# Patient Record
Sex: Female | Born: 1965 | Race: White | Hispanic: No | Marital: Married | Smoking: Never smoker
Health system: Southern US, Community
[De-identification: ages and names within clinical notes are randomized; demographics above are authoritative.]

## PROBLEM LIST (undated history)

## (undated) DIAGNOSIS — D472 Monoclonal gammopathy: Secondary | ICD-10-CM

## (undated) DIAGNOSIS — N200 Calculus of kidney: Secondary | ICD-10-CM

## (undated) DIAGNOSIS — R161 Splenomegaly, not elsewhere classified: Secondary | ICD-10-CM

## (undated) DIAGNOSIS — E538 Deficiency of other specified B group vitamins: Secondary | ICD-10-CM

## (undated) DIAGNOSIS — M069 Rheumatoid arthritis, unspecified: Secondary | ICD-10-CM

## (undated) DIAGNOSIS — K760 Fatty (change of) liver, not elsewhere classified: Secondary | ICD-10-CM

## (undated) HISTORY — DX: Monoclonal gammopathy: D47.2

## (undated) HISTORY — DX: Deficiency of other specified B group vitamins: E53.8

## (undated) HISTORY — DX: Fatty (change of) liver, not elsewhere classified: K76.0

## (undated) HISTORY — DX: Splenomegaly, not elsewhere classified: R16.1

---

## 1989-01-12 HISTORY — PX: ECTOPIC PREGNANCY SURGERY: SHX613

## 1989-01-12 HISTORY — PX: SALPINGOOPHORECTOMY: SHX82

## 1998-05-14 HISTORY — PX: LAPAROSCOPIC SALPINGO OOPHERECTOMY: SHX5927

## 1998-08-23 ENCOUNTER — Emergency Department (HOSPITAL_COMMUNITY): Admission: EM | Admit: 1998-08-23 | Discharge: 1998-08-23 | Payer: Self-pay | Admitting: Emergency Medicine

## 1999-01-13 HISTORY — PX: HIP SURGERY: SHX245

## 1999-03-05 ENCOUNTER — Emergency Department (HOSPITAL_COMMUNITY): Admission: EM | Admit: 1999-03-05 | Discharge: 1999-03-05 | Payer: Self-pay

## 1999-03-09 ENCOUNTER — Encounter: Payer: Self-pay | Admitting: Urology

## 1999-03-09 ENCOUNTER — Observation Stay (HOSPITAL_COMMUNITY): Admission: EM | Admit: 1999-03-09 | Discharge: 1999-03-10 | Payer: Self-pay | Admitting: Emergency Medicine

## 1999-03-13 ENCOUNTER — Emergency Department (HOSPITAL_COMMUNITY): Admission: EM | Admit: 1999-03-13 | Discharge: 1999-03-13 | Payer: Self-pay

## 1999-08-31 ENCOUNTER — Encounter: Payer: Self-pay | Admitting: Emergency Medicine

## 1999-08-31 ENCOUNTER — Emergency Department (HOSPITAL_COMMUNITY): Admission: EM | Admit: 1999-08-31 | Discharge: 1999-08-31 | Payer: Self-pay | Admitting: Emergency Medicine

## 1999-12-04 ENCOUNTER — Emergency Department (HOSPITAL_COMMUNITY): Admission: EM | Admit: 1999-12-04 | Discharge: 1999-12-04 | Payer: Self-pay | Admitting: Internal Medicine

## 2000-01-17 ENCOUNTER — Other Ambulatory Visit: Admission: RE | Admit: 2000-01-17 | Discharge: 2000-01-17 | Payer: Self-pay | Admitting: *Deleted

## 2000-04-23 ENCOUNTER — Encounter: Payer: Self-pay | Admitting: Emergency Medicine

## 2000-04-23 ENCOUNTER — Emergency Department (HOSPITAL_COMMUNITY): Admission: EM | Admit: 2000-04-23 | Discharge: 2000-04-23 | Payer: Self-pay | Admitting: Emergency Medicine

## 2000-04-24 ENCOUNTER — Inpatient Hospital Stay (HOSPITAL_COMMUNITY): Admission: EM | Admit: 2000-04-24 | Discharge: 2000-04-30 | Payer: Self-pay | Admitting: Emergency Medicine

## 2000-04-24 ENCOUNTER — Encounter: Payer: Self-pay | Admitting: Emergency Medicine

## 2000-04-29 ENCOUNTER — Encounter: Payer: Self-pay | Admitting: Orthopedic Surgery

## 2000-06-29 ENCOUNTER — Encounter: Payer: Self-pay | Admitting: Specialist

## 2000-06-29 ENCOUNTER — Inpatient Hospital Stay (HOSPITAL_COMMUNITY): Admission: EM | Admit: 2000-06-29 | Discharge: 2000-07-03 | Payer: Self-pay | Admitting: Emergency Medicine

## 2002-01-27 ENCOUNTER — Other Ambulatory Visit: Admission: RE | Admit: 2002-01-27 | Discharge: 2002-01-27 | Payer: Self-pay | Admitting: Family Medicine

## 2003-01-23 ENCOUNTER — Emergency Department (HOSPITAL_COMMUNITY): Admission: EM | Admit: 2003-01-23 | Discharge: 2003-01-23 | Payer: Self-pay | Admitting: Emergency Medicine

## 2003-01-23 ENCOUNTER — Encounter: Payer: Self-pay | Admitting: Emergency Medicine

## 2008-06-16 ENCOUNTER — Encounter: Admission: RE | Admit: 2008-06-16 | Discharge: 2008-06-16 | Payer: Self-pay | Admitting: Internal Medicine

## 2008-07-13 ENCOUNTER — Encounter: Admission: RE | Admit: 2008-07-13 | Discharge: 2008-07-13 | Payer: Self-pay | Admitting: Internal Medicine

## 2010-06-04 ENCOUNTER — Encounter: Payer: Self-pay | Admitting: Internal Medicine

## 2010-09-29 NOTE — Discharge Summary (Signed)
[facility]  Patient:    [name], [name]                   MRN: [id number] Adm. Date:  [id number] Disc. Date: 04/30/00 Attending:  Cain Sieve Dictator:   Ralene Bathe, P.A. CC:         Dr. Ninetta Lights   Discharge Summary  ADMISSION DIAGNOSIS:  Septic arthritis left hip.  DISCHARGE DIAGNOSES: 1. Septic arthritis left hip, with unknown organism, negative cultures. 2. Status post irrigation and debridement, arthrotomy of left hip. 3. Peripherally inserted central catheter line placement for continued    intravenous antibiotics.  CONSULTATIONS:  Dr. Ninetta Lights, infectious disease.  OPERATIONS:  Left hip arthrotomy, irrigation and debridement.  Surgeon: Dr. Rennis Chris.  Assistant:  Cherly Beach, P.A.C.  Anesthesia:  General.  With placement of intraoperative Hemovac drain sutured in.  HISTORY OF PRESENT ILLNESS:  [name] is a 45 year old female who presented to the emergency room on the evening prior to admission with a 24-hour history of increasing severe left hip pain.  She woke up that morning with left hip pain that became quite severe and intractable throughout the day.  On admission evaluation by the ER staff, she was found to have limitations of left hip motion, and laboratory work revealed a white count within normal limits but a mildly elevated sedimentation rate at 34.  Plain x-rays negative. She initially had some fair relief of pain with analgesics and was scheduled for an aspiration of the left hip that morning.  Aspiration under fluoroscopic guidance found cloudy, purulent-appearing fluid.  She, therefore, was called for an orthopedic evaluation and was thought to require arthrotomy, irrigation and debridement of the left hip.  The risks and benefits were discussed with the patient, and she was in agreement to proceed.  HOSPITAL COURSE:  The patient was admitted and underwent the above-named procedure and tolerated this well.   Approximately 15 cc of purulent synovial fluid within the hip joint was found.  A Hemovac drain was sutured in.  The purulent-appearing fluid was sent for intraoperative culture and sensitivity and Grams stain, as well as the hip aspirate.  The patient tolerated the procedure well.  She was kept inpatient on IV antibiotics as cultures had continued to be held.  She was initially treated with Rocephin 1 g IV q.d. However, she was placed on Ancef postoperatively, then switched back to Rocephin 1 g IV q.d. at the recommendations of infectious disease.  Initially, the patients review of systems was negative.  Initial Grams stains also showed few wbcs, no organisms.  It was quite interesting that she had no growth from the cultures.  Infectious disease consult was called to assist with her care postoperatively.  Further workup included HIV testing, ANA rheumatoid factors, and GC probes for urine.  These were all essentially negative except for mild elevated RA factor at 33.  Her RPR also was nonreactive.  Urinalysis also shown to be negative.  All in all, the patient remained stable postoperatively.  She had good reduction of the hip pain.  The Hemovac was removed on postoperative day #3, on April 27, 2000, without complications.  The patient had a PICC line placed prior to discharge.  Hip incision remained dry, with no erythema, no drainage.  On recommendation of infectious disease she was felt to require 28-day total of IV Rocephin via the PICC line 1 g q.d.  On date April 30, 2000, the patient was afebrile. Incision was dry,  without drainage.  She was neurovascularly intact and tolerating ambulation without complications.  She was stable orthopedically and medically for discharge.  LABORATORY DATA:  Admission hemogram with white count of 5.9, hemoglobin 11.7. Potassium 3.3.  Otherwise normal.  HIV is nonreactive.  Synovial fluid cell count from initial aspirate showed turbid  appearance, 62,400 white cells, 90 neutrophils, 9 macrophages, no crystals.  Body fluid culture remained negative.  No organisms seen on Grams stain and few wbcs.  GC probe was negative.  Chlamydia probe negative.  Urine rheumatoid factor borderline elevated at 33, RPR nonreactive.  Antistreptolysin O 184 normal, ANA was negative at the time of discharge.  Radiology shows PICC line placement in the superior vena cava in satisfactory position.  Fluoroscopy used for left hip aspiration noted in chart.  CONDITION ON DISCHARGE:  Stable and improved.  DISPOSITION:  The patient is being discharged to home.  DISCHARGE PLAN:  The patient will receive IV Rocephin 1 g q.d. via the PICC line.  Follow up in our office in one week, call for a time.  At this time she may shower.  Keep PICC line and hip incision as dry as possible.  An ______ was placed over the PICC line bathing purposes.  The patient was given Lorcet 10/650 #40, 1 q.4-6h. p.r.n. pain.  Call the office for any further problems or increasing hip pain or drainage.  She should also document fevers, however, has continued to be afebrile. DD:  04/30/00 TD:  04/30/00 Job: [id number]

## 2010-09-29 NOTE — Discharge Summary (Signed)
[facility]  Patient:    [name], [name]                   MRN: [id number] Adm. Date:  [id number] Disc. Date: [id number] Attending:  Pierce Crane Dictator:   Alexzandrew L. Julien Girt, P.A.-C. CC:         Burnice Logan, M.D.   Discharge Summary  ADMITTING DIAGNOSIS:  Septic right hip.  DISCHARGE DIAGNOSIS:  Septic right hip, status post I&D right hip.  PROCEDURE:  The patient was taken to the OR on June 29, 2000, and underwent I&D of the right hip.  SURGEON:  Javier Docker, M.D.  ASSISTANT:  Cherly Beach, P.A.-C.  DRAIN:  Hemovac drain placed at the time of surgery.  CONSULTATIONS:  Infectious disease, Dr. Roxan Hockey.  BRIEF HISTORY:  The patient is a 45 year old female who has previously been seen by [place] for problems concerning her right hip.  She was seen in [facility] Emergency Department on the evening prior to admission for a 24 hour increasing severe right hip pain.  On evaluation the day of admission she was found to have limitations with right hip motion.  CBC revealed a normal white count, but a mildly elevated sedimentation rate.  Plain x-rays were negative. She did get relief from analgesics, but was scheduled for an aspiration. Aspiration showed cloudy, purulent appearing fluid.  Dr. Rennis Chris was called to evaluate.  The patient was seen and admitted for surgical intervention.  LABORATORY DATA:  CBC on admission was hemoglobin 14.1, hematocrit 39.7, elevated white count 10.9, red cell count 4.51.  Differential showed neutrophils elevated at 78, lymphs 17, monos 6, eosinophils 1, baso 0.  Follow up CBC the following day showed a decreased in white count to 9.8, hemoglobin and hematocrit were 11.8 and 33.1.  Last noted CBC taken on July 02, 2000, white count was down to 6.7, hemoglobin back up to 12.3 and hematocrit up to 34.8 and the differential was normal.  Sedimentation rate on admission was elevated at 33.   Urine pregnancy taken was negative.  Fluid aspiration: color yellow, had a turbid appearance, greater than 14,700 white cells.  Neutrophils was high at 96, but 0 lymphs.  Urinalysis on admission showed trace hemoglobin, many epithelial cells and only 0-5 red cells per high-power field. Follow up urinalysis showed only small hemoglobin trace, 15 ketones, rare epithelial cells and only 0-5 red cells.  Urine culture taken on July 01, 2000, no growth.  The wound culture taken at the time of surgery, gram stain showed moderate WBCs, no organisms, no growth after two days.  The second routine culture gram stain showed few WBCs, no organisms seen, no growth after two days.  Anaerobic cultures x 2 both showed no anaerobes isolated.  X-RAYS:  Right hip films taken on June 29, 2000, no evidence of fracture or dislocation, no other significant bone or soft tissue or abnormalities are identified.  Joint spaces are within normal limits, normal study.  CT of the pelvis and abdomen taken on June 29, 2000, normal CT of the abdomen.  CT of the pelvis shows a large cyst in the left ovary measuring 4 cm.  This is larger than on the previous exam where it is measuring only 3.4 cm.  Follow up ultrasound in four to six weeks is recommended.  No uretal calculi are demonstrated.  HOSPITAL COURSE:  The patient is admitted to [facility] due to sepsis of her right hip.  She  was taken to the emergency room later that evening and underwent an I&D of the right hip per Dr. Shelle Iron.  Hemovac drain was placed at the time of surgery.  Cultures were taken and sent for studies. The patient did have a mildly elevated white count noted on admission, however, white count did come back down.  She was placed on IV antibiotics to include Rocephin 1 g every 24 hours.  The Hemovac drain was pulled on postoperative day #2.  The patient did have some elevated temperatures on postoperative day #1 of 100.9 and also  on postoperative day #2 of 100.9. Infectious Disease was consulted and the patient was seen and evaluated by Dr. Burnice Logan.  MRI scan was recommended and also rheumatology consult.  The cultures were followed during the hospital course which appeared to be negative for organisms.  There was some belief that this may be an inflammatory process.  It was decided that we would have her follow up and have an evaluation by a rheumatologist and that she would have an MRI of both hips once the wounds had healed.  The patient continued to be treated symptomatically.  She was later weaned over to p.o. analgesics.  It was recommended that she continue on Levaquin by mouth once discharged.  On July 03, 2000, the patient was doing much better, minimal pain.  She had been weaned over to p.o. analgesics and it was decided that the patient would be discharged home at that time.  DISCHARGE PLAN: 1. The patient discharged home on July 03, 2000. 2. Discharge diagnosis, please see above.  DISCHARGE MEDICATIONS: 1. Percocet p.r.n. pain. 2. Robaxin p.r.n. spasms. 3. Levaquin 500 mg daily.  DIET:  As tolerated.  ACTIVITY:  Weight bearing as tolerated.  Dressing changes daily.  FOLLOW UP:  The patient is to follow up with Dr. Shelle Iron in approximately 7-10 days.  An outpatient rheumatologic evaluation has been scheduled with Dr. Jimmy Footman on Thursday, at 11:25.  His office number is [phone].  The patient is to follow with Dr. Jimmy Footman for rheumatologic evaluation.  DISPOSITION:  Home.  CONDITION UPON DISCHARGE:  Stable. DD:  07/26/00 TD:  07/26/00 Job: [id number]

## 2010-09-29 NOTE — H&P (Signed)
[facility]  Patient:    [name], [name]                   MRN: [id number] Adm. Date:  [id number] Disc. Date: [id number] Attending:  Pierce Crane Dictator:   Della Goo, P.A.                         History and Physical  This dictated history and physical is being dictated from Dr. Vania Rea. Supples written history and physical on the day of admission.  CHIEF COMPLAINT:  Left hip pain.  HISTORY OF PRESENT ILLNESS:  Ms. Gazzola is a 45 year old female who presented to [facility] Emergency Room on the evening prior to admission with a 24-hour history of increasing severe left hip pain.  On evaluation on the day of admission, she was found to have limitations of left hip motion.  Laboratory values revealed WBC count within normal limits, but a mildly elevated sedimentation rate.  Plain x-rays were negative.  She did get some relief from her analgesics and was scheduled for an aspiration of the left hip on the morning following her first presentation to the emergency room.  Aspiration under fluoroscopy showed cloudy, purulent-appearing fluid.  Dr. Rennis Chris was called to evaluate the patient to undergo surgical irrigation and debridement. Patient was admitted for surgical I&D for treatment of her septic arthritis of the left hip.  ALLERGIES:  No known drug allergies.  CURRENT MEDICATIONS:  None.  PAST MEDICAL HISTORY:  Negative.  PAST SURGICAL HISTORY:  Significant for an ectopic pregnancy in 1993, also an exploratory laparoscopy in the past.  SOCIAL HISTORY:  Patient recently separated from her husband.  She has rare intake of alcohol and no intake of tobacco.  She denies any substance abuse. She currently lives with a friend in a mobile home.  FAMILY HISTORY:  Noncontributory to present illness.  REVIEW OF SYSTEMS:  Negative with the exception of acute onset of left hip pain.  PHYSICAL EXAMINATION  GENERAL:  Patient is a  well-developed, well-nourished female, alert and oriented x 4, in moderate discomfort.  HEENT:  Normocephalic, atraumatic.  Pupils equal, round and reactive to light and accommodation.  Extraocular movements intact.  LUNGS:  Clear to auscultation.  HEART:  Regular rate and rhythm.  No murmur heard.  ABDOMEN:  Soft, nontender.  Bowel sounds present.  GENITAL:  Not performed, not pertinent to present illness.  RECTAL:  Not performed, not pertinent to present illness.  BREASTS:  Not performed, not pertinent to present illness.  EXTREMITIES:  Painful range of motion of the left hip.  Distal pulses and sensation are intact in both lower extremities.  IMPRESSION:  Septic left hip.  PLAN:  Patient is admitted to the hospital for surgical irrigation and debridement as well as IV antibiotic treatment. DD:  07/03/00 TD:  07/04/00 Job: [id number]

## 2010-09-29 NOTE — Op Note (Signed)
[facility]  Patient:    OTTILIA, [name]                   MRN: [id number] Proc. Date: 04/24/00 Adm. Date:  [id number] Disc. Date: [id number] Attending:  Ilene Qua                           Operative Report  PREOPERATIVE DIAGNOSIS:  Septic left hip.  POSTOPERATIVE DIAGNOSIS:  Septic left hip.  PROCEDURE:  Left hip arthrotomy, irrigation and debridement.  SURGEON:  Vania Rea. Supple, M.D.  ASSISTANTDruscilla Brownie. Shela Nevin, P.A.  ANESTHESIA:  General endotracheal.  ESTIMATED BLOOD LOSS:  Less than 50 cc.  DRAINS:  A medium Hemovac x 1.  INTRAOPERATIVE FINDINGS:  Approximately 15 cc of purulent synovial fluid within the hip joint.  HISTORY:  [name] is a 45 year old female who presented to the emergency room last evening with a 24-hour history of severe left hip pain.  She reports that she awoke in the morning with quite severe left hip pain which was intractable and became increasingly severe throughout the day.  On initial presentation to the emergency room, she was evaluated by the ER staff and was noted to have pain and limitations of left hip motion.  Lab work revealed a white count within normal limits but a mildly elevated sed rate at 34.  Plain radiographs were negative.  She subsequently had fair relief of her pain with analgesics and was scheduled for an aspiration of the left hip in the morning. Following aspiration under fluoroscopic guidance, she was found to have cloudy purulent-appearing fluid within the hip and she has subsequently been brought to the operating room for arthrotomy, irrigation and debridement of the left hip.  Preoperatively, [name] was counseled on treatment options as well as risks versus benefits thereof.  Possible complications of bleeding, infection, neurovascular injury, DVT, PE, persistent septic arthritis and osteoarthrosis were reviewed.  She understands and accepts and agrees with our  plan for arthrotomy, irrigation and debridement.  PROCEDURE IN DETAIL:  After undergoing routine preoperative evaluation, patient was brought to the operating room and placed supine on the operative table and underwent smooth induction of general endotracheal anesthesia.  She was turned to the right lateral decubitus position and appropriately padded and protected.  The left hip girdle region was sterilely prepped and draped in standard fashion.  She had already received a gram of Rocephin IV empirically. A standard posterior approach to the left hip was made through a minimal incision, beginning typically at the greater trochanter and extending posteriorly in line with the fiber of the gluteus maximus for at total length of approximately 6 cm.  Sharp dissection was carried down through the skin and subcu, with electrocautery used for hemostasis.  The fascia over the gluteus maximus was then divided, as were the fibers of the gluteus maximus which were split longitudinally; this allowed exposure of the piriformis and the short external rotators.  The piriformis was then traced distally towards its greater trochanteric insertion and divided with electrocautery and reflected posteriorly.  The underlying capsule was then divided using electrocautery and a capsulotomy was then made, approximately 2 x 2 cm.  Upon entrance into the hip joint, a copious amount of purulent-appearing fluid was obtained; this was sent for routine culture and sensitivity as well as Gram stain.  At this point, a pediatric feeding tube was then set into the hip  joint and multiple syringes of double antibiotic and saline irrigation were then passed through the hip joint as the hip was placed through a full range of motion.  We then placed a medium Hemovac drain in through the capsulotomy into the inferior recess of the hip joint and then brought it out through a separate stab wound. The surgical wound was then closed  with interrupted figure-of-eight #1 Vicryl for the fascia over the gluteus maximus, 2-0 Vicryl for the deep and superficial subcu and a running intracuticular 4-0 Monocryl for the skin. Steri-Strips were applied over the hip incision.  The drain was sutures into place.  Patient was then rolled supine, extubated and taken to the recovery room in stable condition. DD:  04/24/00 TD:  04/25/00 Job: [id number]

## 2010-09-29 NOTE — H&P (Signed)
[facility]  Patient:    [name], [name]                   MRN: [id number] Adm. Date:  [id number] Attending:  Pierce Crane Dictator:   Druscilla Brownie. Shela Nevin, P.A.                         History and Physical  CHIEF COMPLAINT:  "Pain in my left hip."  PRESENT ILLNESS:  This 45 year old female who has previously been seen by [place] for problems concerning her left hip and has previously undergone left hip arthrotomy on an admission on April 24, 2000.  At that time, Dr. Vania Rea. Supple performed arthrotomy, irrigation and debridement of the joint.  At that time, the synovial fluid from the left hip showed 62,400 wbcs, 90 neutrophils and macrophages.  Cultures were negative and no organisms were seen.  HIV was nonreactive.  Chlamydia probe was negative. Urine rheumatoid factor was border-elevated at 33.  RPR was nonreactive.  ANA was negative.  Patient was treated with Ancef postoperatively and then switched to Rocephin 1 g IV q.d.  Infectious disease saw the patient at that time.  She improved on these treatments and was discharged home.  This admission is for increasing pain into the same hip, which has been going on now for several weeks.  Patient will be admitted for arthrotomy of the left hip.  PAST MEDICAL HISTORY:  She is on no antibiotics at this time.  Please see previous H&P for details.  REVIEW OF SYSTEMS:  Essentially negative except for hip.  ALLERGIES:  No known drug allergies.  PHYSICAL EXAMINATION  GENERAL:  Alert, cooperative, friendly and somewhat distressed 45 year old white female whose vital signs are:  Blood pressure 137/82, pulse 97, respirations 24, temperature 97.4.  HEENT:  Normocephalic.  PERRLA.  Oropharynx was clear.  CHEST:  Clear to auscultation.  No rhonchi nor rales.  HEART:  Regular rate and rhythm.  No murmurs are heard.  ABDOMEN:  Soft and nontender.  Liver and spleen not  felt.  GENITALIA/PELVIC:  Not done, not pertinent to present illness.  BREASTS:  Not done, not pertinent to present illness.  RECTAL:  Not done, not pertinent to present illness.  EXTREMITIES:  Patient has severe hip pain with any attempted rotation on the left.  Sensory is intact.  ADMITTING DIAGNOSES 1. Possible infection to the right hip with sepsis -- septic right hip. 2. Previous ______  and debridement of left hip. DD:  06/30/00 TD:  06/30/00 Job: [id number]

## 2010-09-29 NOTE — Op Note (Signed)
[facility]  Patient:    [name], [name]                   MRN: [id number] Proc. Date: 06/29/00 Adm. Date:  [id number] Attending:  Pierce Crane                           Operative Report  PREOPERATIVE DIAGNOSIS:  Septic right hip.  POSTOPERATIVE DIAGNOSIS:  Septic right hip.  PROCEDURE:  Right hip open arthrotomy, irrigation and debridement.  ANESTHESIA:  General.  ASSISTANT:  Dooley L. Underwood III, P.A.-C.  BRIEF HISTORY AND INDICATION:  A 45 year old with a history of a left septic hip, I&D, presented to the ER with severe hip pain.  Elevated sedimentation rate, positive aspirate under fluoroscopy.  Operative intervention was indicated for irrigation and debridement of septic hip to prevent chondrolysis.  Risks and benefits discussed including bleeding, infection, damage to surrounding structures, no change in symptoms, need for repeat debridement, post-traumatic arthrosis, osteoarthritis, AV, etc.  TECHNIQUE:  The patient is in supine position.  After the induction of adequate general anesthesia, 1 gram of IV Rocephin given that the aspirate was sent for appropriate cultures and synovial analysis.  The patient was placed in the left lateral decubitus position.  All bony prominences well padded. Right peritrochanteric region and the lower extremity was prepped and draped in the usual sterile fashion.  Standard lateral approach was made to the hip. Incision made through the skin over the trochanter.  Subcutaneous tissue was dissected.  Electrocautery was utilized to achieve hemostasis.  Fascia lata identified and divided in line with the skin incision.  The gluteus medius was split.  Blunt dissection to the posterior capsule after the bursa was divided. Deaver blunt retractor was utilized to expose the external rotators of the hip.  Piriformis was then divided and traced to its insertion on the trochanter, was divided.  ______  insertion with electrocautery, retagged, and gently reflected posteriorly, protecting the sciatic nerve at all times.  The capsule was identified beneath this.  A small ______ otomy performed in the external rotators as well.  A capsulotomy was performed, roughly 2 x 2 cm. Tension to the hip wound produced approximately 5 cc of a cloudy purulent fluid.  This was cultured.  The hip was then copiously irrigated with a pediatric feeding tube with antibiotic irrigation.  The hip was then placed through a full range of motion while copious irrigation was performed.  A Hemovac was placed into the hip capsule and brought out through a separate stab wound anteriorly through the tensor fascia and the skin.  Soft tissue was copiously irrigated.  Retractors removed and the tensor fascia reapproximated with #1 Vicryl interrupted figure-of-eight sutures, subcutaneous tissue reapproximated with 2-0 Vicryl simple sutures.  Skin was reapproximated with staples and dressed sterilely.  Hemovac was charged.  The patient was extubated without difficulty and transported to the recovery room in satisfactory condition.  The patient tolerated the procedure well without complications. DD:  06/29/00 TD:  06/30/00 Job: [id number]/[id number]

## 2012-01-04 ENCOUNTER — Encounter (HOSPITAL_COMMUNITY): Payer: Self-pay

## 2012-01-04 ENCOUNTER — Emergency Department (HOSPITAL_COMMUNITY)
Admission: EM | Admit: 2012-01-04 | Discharge: 2012-01-04 | Disposition: A | Discharge status: Home Health | Payer: BC Managed Care – PPO | Attending: Emergency Medicine | Admitting: Emergency Medicine

## 2012-01-04 DIAGNOSIS — W208XXA Other cause of strike by thrown, projected or falling object, initial encounter: Secondary | ICD-10-CM | POA: Insufficient documentation

## 2012-01-04 DIAGNOSIS — R51 Headache: Secondary | ICD-10-CM | POA: Insufficient documentation

## 2012-01-04 DIAGNOSIS — M7989 Other specified soft tissue disorders: Secondary | ICD-10-CM | POA: Insufficient documentation

## 2012-01-04 DIAGNOSIS — R42 Dizziness and giddiness: Secondary | ICD-10-CM | POA: Insufficient documentation

## 2012-01-04 DIAGNOSIS — G44309 Post-traumatic headache, unspecified, not intractable: Secondary | ICD-10-CM

## 2012-01-04 MED ORDER — SODIUM CHLORIDE 0.9 % IV SOLN
1000.0000 mL | Freq: Once | INTRAVENOUS | Status: AC
  Administered 2012-01-04: 1000 mL via INTRAVENOUS

## 2012-01-04 MED ORDER — ACETAMINOPHEN 500 MG PO TABS: 500.0000 mg | ORAL_TABLET | Freq: Four times a day (QID) | ORAL | Status: AC | PRN

## 2012-01-04 MED ORDER — KETOROLAC TROMETHAMINE 30 MG/ML IJ SOLN
30.0000 mg | Freq: Once | INTRAMUSCULAR | Status: AC
  Administered 2012-01-04: 30 mg via INTRAVENOUS
  Filled 2012-01-04: qty 1

## 2012-01-04 MED ORDER — METOCLOPRAMIDE HCL 5 MG/ML IJ SOLN
10.0000 mg | Freq: Once | INTRAMUSCULAR | Status: AC
  Administered 2012-01-04: 10 mg via INTRAVENOUS
  Filled 2012-01-04: qty 2

## 2012-01-04 MED ORDER — DIPHENHYDRAMINE HCL 50 MG/ML IJ SOLN
12.5000 mg | Freq: Once | INTRAMUSCULAR | Status: AC
  Administered 2012-01-04: 12.5 mg via INTRAVENOUS
  Filled 2012-01-04: qty 1

## 2012-01-04 MED ORDER — SODIUM CHLORIDE 0.9 % IV SOLN
1000.0000 mL | INTRAVENOUS | Status: DC
  Administered 2012-01-04: 1000 mL via INTRAVENOUS

## 2012-01-04 NOTE — ED Notes (Signed)
Patient c/o diffuse headache, patient describes pain as throbbing

## 2012-01-04 NOTE — ED Notes (Signed)
Patient in NAD at this time, ambulating in room

## 2012-01-04 NOTE — ED Notes (Signed)
On Monday night ceiling collapsed and Wednesday am started having headaches, so came today.

## 2012-01-04 NOTE — ED Provider Notes (Signed)
History     CSN: [id number]  Arrival date & time 01/04/12  1238   None     Chief Complaint  Patient presents with  . Headache    (Consider location/radiation/quality/duration/timing/severity/associated sxs/prior treatment) HPI Comments: She denies any history of loss of sensation or weakness in any of her extremities. She has no visual changes, ear pain, or reduced hearing. She denies vomiting or neck pain. No unusual behavior, seizures, increased sleepiness, seizures, or tremors. There was no loss of consciousness immediately after the ceiling collapsed on her head.  Patient is a 45 y.o. female presenting with headaches. The history is provided by the patient.  Headache  This is a new problem. The current episode started 3 to 5 hours ago. The problem occurs constantly. The problem has not changed since onset.The pain is located in the temporal and bilateral region. The quality of the pain is described as dull. The pain is at a severity of 6/10. The pain is moderate. The pain does not radiate. Pertinent negatives include no malaise/fatigue, no near-syncope, no syncope, no shortness of breath, no nausea and no vomiting. She has tried NSAIDs for the symptoms. The treatment provided mild relief.    Past Medical History  Diagnosis Date  . Arthritis     No past surgical history on file.  No family history on file.  History  Substance Use Topics  . Smoking status: Never Smoker   . Smokeless tobacco: Not on file  . Alcohol Use: No    OB History    Grav Para Term Preterm Abortions TAB SAB Ect Mult Living                  Review of Systems  Constitutional: Negative.  Negative for malaise/fatigue.  Eyes: Negative for photophobia, pain, redness and visual disturbance.  Respiratory: Negative for chest tightness and shortness of breath.   Cardiovascular: Negative.  Negative for syncope and near-syncope.  Gastrointestinal: Negative.  Negative for nausea and vomiting.    Genitourinary: Negative.   Musculoskeletal: Negative.   Skin: Negative.   Neurological: Positive for light-headedness and headaches. Negative for speech difficulty and numbness.  Hematological: Negative.   Psychiatric/Behavioral: Negative.     Allergies  Celebrex  Home Medications   Current Outpatient Rx  Name Route Sig Dispense Refill  . ETANERCEPT 50 MG/ML [place] SOLN Subcutaneous Inject 50 mg into the skin once a week. On Mondays      BP 135/82  Pulse 71  Temp 97.9 F (36.6 C) (Oral)  Resp 16  SpO2 97%  Physical Exam  Constitutional: She is oriented to person, place, and time. She appears well-developed and well-nourished.  HENT:  Head: Normocephalic and atraumatic.  Eyes: Conjunctivae and EOM are normal. Pupils are equal, round, and reactive to light.  Neck: Normal range of motion. Neck supple.  Cardiovascular: Normal rate, regular rhythm and normal heart sounds.   Pulmonary/Chest: Effort normal and breath sounds normal. No respiratory distress. She has no wheezes. She has no rales. She exhibits no tenderness.  Abdominal: Soft. Bowel sounds are normal.  Musculoskeletal: She exhibits no edema and no tenderness.       Right elbow: She exhibits swelling.       Left elbow: She exhibits swelling.       There are posterior bilateral rheumatoid nodules on the elbows.  Neurological: She is alert and oriented to person, place, and time. She has normal strength and normal reflexes. She displays normal reflexes. No cranial nerve deficit  or sensory deficit. She exhibits normal muscle tone. She displays a negative Romberg sign. Coordination normal. GCS eye subscore is 4. GCS verbal subscore is 5. GCS motor subscore is 6. She displays no Babinski's sign on the right side. She displays no Babinski's sign on the left side.  Skin: Skin is warm.  Psychiatric: She has a normal mood and affect.    ED Course  Procedures (including critical care time)  Labs Reviewed - No data to display No  results found.   No diagnosis found.    MDM  In this 46 year old woman, with a past medical history of rheumatoid arthritis, who presents after 3 days history of head trauma, only complaining of headache without neurological findings or focal neurological deficits, I don't feel compelled to order any imaging studies of her brain. It is likely that she sustained a brain concussion. I have discussed this with her and she feels she can always come back if the pain worsens or if she develops any abnormal behavior, seizures, or loss of sensation or weakness in one of her limbs. I will further discuss with Dr. Lorenso Courier.  5:58 PM After a dose of Reglan, and ketorolac IV, she reports significant reduction in her headache. She will be discharged on Tylenol. I have encouraged her to seek medical attention if she experiences seizures, loss of consciousness, numbness, or any abnormal behavior.        Dow Adolph, MD 01/04/12 1800

## 2012-01-06 NOTE — ED Provider Notes (Signed)
I saw and evaluated the patient, reviewed the resident's note and I agree with the findings and plan.  Marwan T Powers, MD 01/06/12 1642 

## 2013-07-09 ENCOUNTER — Inpatient Hospital Stay (HOSPITAL_COMMUNITY)
Admission: EM | Admit: 2013-07-09 | Discharge: 2013-07-11 | DRG: 392 | Disposition: A | Discharge status: Home / Self Care | Payer: 59 | Attending: Family Medicine | Admitting: Family Medicine

## 2013-07-09 ENCOUNTER — Emergency Department (HOSPITAL_COMMUNITY): Payer: 59

## 2013-07-09 ENCOUNTER — Encounter (HOSPITAL_COMMUNITY): Payer: Self-pay | Admitting: Emergency Medicine

## 2013-07-09 DIAGNOSIS — M069 Rheumatoid arthritis, unspecified: Secondary | ICD-10-CM | POA: Diagnosis present

## 2013-07-09 DIAGNOSIS — E876 Hypokalemia: Secondary | ICD-10-CM | POA: Diagnosis present

## 2013-07-09 DIAGNOSIS — A088 Other specified intestinal infections: Principal | ICD-10-CM | POA: Diagnosis present

## 2013-07-09 DIAGNOSIS — D696 Thrombocytopenia, unspecified: Secondary | ICD-10-CM | POA: Diagnosis present

## 2013-07-09 DIAGNOSIS — R109 Unspecified abdominal pain: Secondary | ICD-10-CM

## 2013-07-09 DIAGNOSIS — R112 Nausea with vomiting, unspecified: Secondary | ICD-10-CM

## 2013-07-09 DIAGNOSIS — R1115 Cyclical vomiting syndrome unrelated to migraine: Secondary | ICD-10-CM

## 2013-07-09 DIAGNOSIS — K7689 Other specified diseases of liver: Secondary | ICD-10-CM | POA: Diagnosis present

## 2013-07-09 DIAGNOSIS — R197 Diarrhea, unspecified: Secondary | ICD-10-CM

## 2013-07-09 DIAGNOSIS — E860 Dehydration: Secondary | ICD-10-CM | POA: Diagnosis present

## 2013-07-09 DIAGNOSIS — A084 Viral intestinal infection, unspecified: Secondary | ICD-10-CM | POA: Diagnosis present

## 2013-07-09 DIAGNOSIS — D72819 Decreased white blood cell count, unspecified: Secondary | ICD-10-CM | POA: Diagnosis present

## 2013-07-09 DIAGNOSIS — R161 Splenomegaly, not elsewhere classified: Secondary | ICD-10-CM

## 2013-07-09 DIAGNOSIS — B9789 Other viral agents as the cause of diseases classified elsewhere: Secondary | ICD-10-CM | POA: Diagnosis present

## 2013-07-09 HISTORY — DX: Calculus of kidney: N20.0

## 2013-07-09 HISTORY — DX: Rheumatoid arthritis, unspecified: M06.9

## 2013-07-09 LAB — CBC WITH DIFFERENTIAL/PLATELET
BASOS ABS: 0 10*3/uL (ref 0.0–0.1)
Basophils Relative: 0 % (ref 0–1)
EOS PCT: 0 % (ref 0–5)
Eosinophils Absolute: 0 10*3/uL (ref 0.0–0.7)
HCT: 40.9 % (ref 36.0–46.0)
Hemoglobin: 15.4 g/dL — ABNORMAL HIGH (ref 12.0–15.0)
LYMPHS PCT: 27 % (ref 12–46)
Lymphs Abs: 1.1 10*3/uL (ref 0.7–4.0)
MCH: 33.3 pg (ref 26.0–34.0)
MCHC: 37.7 g/dL — ABNORMAL HIGH (ref 30.0–36.0)
MCV: 88.3 fL (ref 78.0–100.0)
Monocytes Absolute: 0.6 10*3/uL (ref 0.1–1.0)
Monocytes Relative: 15 % — ABNORMAL HIGH (ref 3–12)
NEUTROS ABS: 2.3 10*3/uL (ref 1.7–7.7)
Neutrophils Relative %: 58 % (ref 43–77)
PLATELETS: 85 10*3/uL — AB (ref 150–400)
RBC: 4.63 MIL/uL (ref 3.87–5.11)
RDW: 12.7 % (ref 11.5–15.5)
WBC: 4 10*3/uL (ref 4.0–10.5)

## 2013-07-09 LAB — COMPREHENSIVE METABOLIC PANEL
ALBUMIN: 4.4 g/dL (ref 3.5–5.2)
ALT: 59 U/L — AB (ref 0–35)
AST: 46 U/L — AB (ref 0–37)
Alkaline Phosphatase: 73 U/L (ref 39–117)
BUN: 12 mg/dL (ref 6–23)
CALCIUM: 9 mg/dL (ref 8.4–10.5)
CO2: 20 meq/L (ref 19–32)
Chloride: 100 mEq/L (ref 96–112)
Creatinine, Ser: 0.65 mg/dL (ref 0.50–1.10)
GFR calc Af Amer: >90 mL/min (ref >90)
Glucose, Bld: 104 mg/dL — ABNORMAL HIGH (ref 70–99)
Potassium: 3.5 mEq/L — ABNORMAL LOW (ref 3.7–5.3)
SODIUM: 137 meq/L (ref 137–147)
Total Bilirubin: 1.2 mg/dL (ref 0.3–1.2)
Total Protein: 8.6 g/dL — ABNORMAL HIGH (ref 6.0–8.3)

## 2013-07-09 LAB — URINE MICROSCOPIC-ADD ON

## 2013-07-09 LAB — URINALYSIS, ROUTINE W REFLEX MICROSCOPIC
Bilirubin Urine: NEGATIVE
GLUCOSE, UA: NEGATIVE mg/dL
Ketones, ur: >80 mg/dL — AB
Nitrite: NEGATIVE
PH: 6 (ref 5.0–8.0)
PROTEIN: NEGATIVE mg/dL
SPECIFIC GRAVITY, URINE: 1.025 (ref 1.005–1.030)
Urobilinogen, UA: 1 mg/dL (ref 0.0–1.0)

## 2013-07-09 LAB — POC URINE PREG, ED: PREG TEST UR: NEGATIVE

## 2013-07-09 LAB — LIPASE, BLOOD: Lipase: 36 U/L (ref 11–59)

## 2013-07-09 MED ORDER — ACETAMINOPHEN 325 MG PO TABS: 650.0000 mg | ORAL_TABLET | Freq: Four times a day (QID) | ORAL | Status: DC | PRN

## 2013-07-09 MED ORDER — KCL IN DEXTROSE-NACL 20-5-0.9 MEQ/L-%-% IV SOLN
INTRAVENOUS | Status: DC
  Administered 2013-07-09 – 2013-07-11 (×2): via INTRAVENOUS
  Filled 2013-07-09 (×6): qty 1000

## 2013-07-09 MED ORDER — IOHEXOL 300 MG/ML  SOLN
100.0000 mL | Freq: Once | INTRAMUSCULAR | Status: AC | PRN
  Administered 2013-07-09: 100 mL via INTRAVENOUS

## 2013-07-09 MED ORDER — METOCLOPRAMIDE HCL 5 MG/ML IJ SOLN
10.0000 mg | Freq: Once | INTRAMUSCULAR | Status: AC
  Administered 2013-07-09: 10 mg via INTRAVENOUS
  Filled 2013-07-09: qty 2

## 2013-07-09 MED ORDER — ENOXAPARIN SODIUM 40 MG/0.4ML ~~LOC~~ SOLN: 40.0000 mg | SUBCUTANEOUS | Status: DC

## 2013-07-09 MED ORDER — SODIUM CHLORIDE 0.9 % IV BOLUS (SEPSIS)
1000.0000 mL | Freq: Once | INTRAVENOUS | Status: AC
  Administered 2013-07-09: 1000 mL via INTRAVENOUS

## 2013-07-09 MED ORDER — PROMETHAZINE HCL 25 MG RE SUPP
25.0000 mg | Freq: Four times a day (QID) | RECTAL | Status: DC | PRN
  Filled 2013-07-09: qty 1

## 2013-07-09 MED ORDER — ONDANSETRON HCL 4 MG/2ML IJ SOLN
4.0000 mg | Freq: Four times a day (QID) | INTRAMUSCULAR | Status: DC | PRN
  Administered 2013-07-09 – 2013-07-10 (×3): 4 mg via INTRAVENOUS
  Filled 2013-07-09 (×3): qty 2

## 2013-07-09 MED ORDER — GI COCKTAIL ~~LOC~~
30.0000 mL | Freq: Once | ORAL | Status: AC
  Administered 2013-07-09: 30 mL via ORAL
  Filled 2013-07-09: qty 30

## 2013-07-09 MED ORDER — ONDANSETRON HCL 4 MG/2ML IJ SOLN
4.0000 mg | Freq: Once | INTRAMUSCULAR | Status: AC
  Administered 2013-07-09: 4 mg via INTRAVENOUS
  Filled 2013-07-09: qty 2

## 2013-07-09 MED ORDER — HYDROMORPHONE HCL PF 1 MG/ML IJ SOLN
1.0000 mg | Freq: Once | INTRAMUSCULAR | Status: AC
  Administered 2013-07-09: 1 mg via INTRAVENOUS
  Filled 2013-07-09: qty 1

## 2013-07-09 MED ORDER — MORPHINE SULFATE 2 MG/ML IJ SOLN
1.0000 mg | INTRAMUSCULAR | Status: DC | PRN
  Administered 2013-07-09 – 2013-07-10 (×3): 2 mg via INTRAVENOUS
  Filled 2013-07-09 (×3): qty 1

## 2013-07-09 MED ORDER — IOHEXOL 300 MG/ML  SOLN
25.0000 mL | Freq: Once | INTRAMUSCULAR | Status: AC | PRN
  Administered 2013-07-09: 25 mL via ORAL

## 2013-07-09 MED ORDER — ACETAMINOPHEN 650 MG RE SUPP: 650.0000 mg | Freq: Four times a day (QID) | RECTAL | Status: DC | PRN

## 2013-07-09 NOTE — ED Notes (Signed)
Dr Cook at bedside

## 2013-07-09 NOTE — H&P (Signed)
[facility] Admission History and Physical Service Pager: [phone]  Patient name: [name] Medical record number: [id number] Date of birth: 1965/11/08 Age: 48 y.o. Gender: female  Primary Care Provider: No PCP Per Patient Consultants: None Code Status: Full code  Chief Complaint: Nausea, vomiting, decreased PO intake  Assessment and Plan: [name] is a 48 y.o. female presenting with epigastric abdominal pain with associated nausea and decreased oral intake . PMH is significant for Rheumatoid arthritis.  Dehydration secondary to nausea and decreased PO intake, Epigastric abdominal pain - DDX: Viral, gastritis, PUD - Patient well appearing on exam.  No leukocytosis to suggest underlying bacterial infection.  - Urinalysis revealed small leukocytes and moderate hemoglobin; however patient denying dysuria. No indication for treatment at this time.  Will obtain urine culture. - CT abdomen/pelvis revealed no findings that would account for patient's epigastric pain.  He did however show splenomegaly and hepatic steatosis.   - Patient with normal lipase and mild LFT elevation. - Will treat with IV fluids and antiemetics and monitor closely for improvement.  - Clear liquid diet.  Splenomegaly and Thrombocytopenia - Unclear etiology, but could be secondary to ITP or underlying RA - Will continue to monitor closely and trend CBC.  Mild hypokalemia - K+ 3.5  - Repleting with K in fluids (see below)   FEN/GI: Clear liquid diet; D5 NS w/ 20 KCl @ 100 mL/hr Prophylaxis: Lovenox  Disposition:  Pending clinical improvement.   History of Present Illness:  [name] is a 48 y.o. female with past medical history rheumatoid arthritis who presents with a four-day history of epigastric abdominal pain with associated nausea and decreased oral intake.  Patient reports that her abdominal pain began 4 days ago.  Pain is located in the epigastric  region.  Pain described as sharp and severe (7/10). Pain has been constant and has been recently worsening.  It is been associated with nausea diarrhea.  She has also had a few episodes of emesis after trying to eat. Pain improves with "sleeping it off".  Abdominal pain is worsened by physical activity.   No other exacerbating or relieving factors.  Patient is unsure if it worsens with oral intake as she has been unable to keep anything down.  She denies any chest pain, shortness of breath, recent illness, hematemesis, hematochezia, melena.   Denies any alcohol or drug use.  Reports occasional NSAID use.    Review Of Systems: Per HPI.  Otherwise 12 point review of systems was performed and was unremarkable.  Past Medical History: Past Medical History  Diagnosis Date  . Arthritis   . Ectopic pregnancy   Dr. Dorothey Baseman - rheumatologist  Past Surgical History: Past Surgical History  Procedure Laterality Date  . Oophorectomy    . Hip surgery     Social History: History  Substance Use Topics  . Smoking status: Never Smoker   . Smokeless tobacco: Not on file  . Alcohol Use: No   Family History: Cancer - MGM, Uncles HTN - Mother DM - PGM  Allergies and Medications: Allergies  Allergen Reactions  . Celebrex [Celecoxib] Hives  . Sulfur     unknown   No current facility-administered medications on file prior to encounter.   Current Outpatient Prescriptions on File Prior to Encounter  Medication Sig Dispense Refill  . etanercept (ENBREL) 50 MG/ML injection Inject 50 mg into the skin once a week. On Mondays       Objective: BP 136/85  Pulse 90  Temp(Src) 98.6 F (37 C) (Oral)  Resp 18  Ht 5' 5.5" (1.664 m)  Wt 168 lb (76.204 kg)  BMI 27.52 kg/m2  SpO2 100% Exam: General: resting in bed, appears fatigued. NAD. HEENT: Dry mucous membranes. Cardiovascular: RRR. No murmur noted. Respiratory: CTAB. No rales, rhonchi, or wheezing. Abdomen: obese, soft, tender to palpation in the  epigastrium.  No rebound or guarding.  Extremities: No LE edema. Skin: warm, dry, intact. Neuro: No focal deficits.  Labs and Imaging: CBC BMET   Recent Labs Lab 07/09/13 0913  WBC 4.0  HGB 15.4*  HCT 40.9  PLT 85*    Recent Labs Lab 07/09/13 0913  NA 137  K 3.5*  CL 100  CO2 20  BUN 12  CREATININE 0.65  GLUCOSE 104*  CALCIUM 9.0     Urinalysis    Component Value Date/Time   COLORURINE YELLOW 07/09/2013 1215   APPEARANCEUR CLEAR 07/09/2013 1215   LABSPEC 1.025 07/09/2013 1215   PHURINE 6.0 07/09/2013 1215   GLUCOSEU NEGATIVE 07/09/2013 1215   HGBUR MODERATE* 07/09/2013 1215   BILIRUBINUR NEGATIVE 07/09/2013 1215   KETONESUR >80* 07/09/2013 1215   PROTEINUR NEGATIVE 07/09/2013 1215   UROBILINOGEN 1.0 07/09/2013 1215   NITRITE NEGATIVE 07/09/2013 1215   LEUKOCYTESUR SMALL* 07/09/2013 1215   Ct Abdomen Pelvis W Contrast 07/09/2013  IMPRESSION: 1. No explanation for patient's abdominal pain. Specifically, no evidence of enteric or urinary obstruction. Normal appearance of the appendix. 2. Hepatic steatosis.  Correlation with LFTs is recommended. 3. Splenomegaly, the etiology of which is not depicted on this examination. 4. Note is made of two adjacent left-sided presumably physiologic ovarian cysts, the largest of which measures approximately 7.3 cm in diameter. Given the size of the dominant cyst, further evaluation with dedicated pelvic ultrasound in 6 weeks is recommended to ensure stability and/or resolution.  Dg Chest Port 1 View 07/09/2013   IMPRESSION: No acute infiltrate or pulmonary edema. Mild perihilar increased bronchial markings without focal consolidation    Coral Spikes, DO 07/09/2013, 3:19 PM PGY-2, [place] [facility] pager: [phone], text pages welcome

## 2013-07-09 NOTE — ED Notes (Signed)
Pt from home, c/o abd pain with runny stools. Pt states she has had abd pain since Monday, has not eaten since. Has not been able to eat.

## 2013-07-09 NOTE — ED Notes (Signed)
Pt vomited medication directly after drinking.

## 2013-07-09 NOTE — Progress Notes (Signed)
Admission note:   Arrival Method: Via stretcher from ED at approx 1800. Mental Status: A&OX4 Telemetry: N/A  Skin: Rheumatoid arthritis nodules noted on bilateral elbows Tubes: N/A IV: LAC 2/26 NSL Pain: 6/10. Medicated with PRN morphine Family: Pt is currently alone Living Situation: Home Safety Measures: Fall prevention safety plan, call bell within reach, watched safety video 6E Orientation: Oriented to unit and surroundings. Given admission packet.  Aaron Edelman, [place]

## 2013-07-09 NOTE — ED Notes (Signed)
Pt states she is unable to drink contrast, " it makes her throw up"

## 2013-07-09 NOTE — ED Provider Notes (Signed)
CSN: [id number]     Arrival date & time 07/09/13  [id number] History   First MD Initiated Contact with Patient 07/09/13 [phone]     Chief Complaint  Patient presents with  . Abdominal Pain     (Consider location/radiation/quality/duration/timing/severity/associated sxs/prior Treatment) Patient is a 48 y.o. female presenting with abdominal pain.  Abdominal Pain Pain location:  Epigastric Pain quality: aching, dull and sharp   Pain radiates to:  Does not radiate Pain severity:  Severe Onset quality:  Gradual Duration:  4 days Timing:  Constant Progression:  Worsening Chronicity:  New Context: not alcohol use and not sick contacts   Relieved by: sleeping. Worsened by:  Eating Associated symptoms: diarrhea and nausea   Associated symptoms: no chest pain, no cough, no dysuria, no fever, no shortness of breath, no vaginal bleeding, no vaginal discharge and no vomiting     Past Medical History  Diagnosis Date  . Arthritis   . Ectopic pregnancy    Past Surgical History  Procedure Laterality Date  . Oophorectomy    . Hip surgery     History reviewed. No pertinent family history. History  Substance Use Topics  . Smoking status: Never Smoker   . Smokeless tobacco: Not on file  . Alcohol Use: No   OB History   Grav Para Term Preterm Abortions TAB SAB Ect Mult Living                 Review of Systems  Constitutional: Negative for fever.  HENT: Negative for congestion.   Respiratory: Negative for cough and shortness of breath.   Cardiovascular: Negative for chest pain.  Gastrointestinal: Positive for nausea, abdominal pain and diarrhea. Negative for vomiting.  Genitourinary: Negative for dysuria, vaginal bleeding and vaginal discharge.  All other systems reviewed and are negative.      Allergies  Celebrex  Home Medications   Current Outpatient Rx  Name  Route  Sig  Dispense  Refill  . etanercept (ENBREL) 50 MG/ML injection   Subcutaneous   Inject 50 mg into the skin  once a week. On Mondays          BP 136/85  Pulse 90  Temp(Src) 98.6 F (37 C) (Oral)  Resp 18  Ht 5' 5.5" (1.664 m)  Wt 168 lb (76.204 kg)  BMI 27.52 kg/m2  SpO2 100% Physical Exam  Nursing note and vitals reviewed. Constitutional: She is oriented to person, place, and time. She appears well-developed and well-nourished. No distress.  HENT:  Head: Normocephalic and atraumatic.  Mouth/Throat: Oropharynx is clear and moist.  Eyes: Conjunctivae are normal. Pupils are equal, round, and reactive to light. No scleral icterus.  Neck: Neck supple.  Cardiovascular: Normal rate, regular rhythm, normal heart sounds and intact distal pulses.   No murmur heard. Pulmonary/Chest: Effort normal and breath sounds normal. No stridor. No respiratory distress. She has no rales.  Abdominal: Soft. Bowel sounds are normal. She exhibits no distension. There is tenderness in the epigastric area. There is no rigidity, no rebound and no guarding.  Musculoskeletal: Normal range of motion.  Neurological: She is alert and oriented to person, place, and time.  Skin: Skin is warm and dry. No rash noted.  Psychiatric: She has a normal mood and affect. Her behavior is normal.    ED Course  Procedures (including critical care time) Labs Review Labs Reviewed  CBC WITH DIFFERENTIAL - Abnormal; Notable for the following:    Hemoglobin 15.4 (*)    MCHC 37.7 (*)  Platelets 85 (*)    Monocytes Relative 15 (*)    All other components within normal limits  COMPREHENSIVE METABOLIC PANEL - Abnormal; Notable for the following:    Potassium 3.5 (*)    Glucose, Bld 104 (*)    Total Protein 8.6 (*)    AST 46 (*)    ALT 59 (*)    All other components within normal limits  URINALYSIS, ROUTINE W REFLEX MICROSCOPIC - Abnormal; Notable for the following:    Hgb urine dipstick MODERATE (*)    Ketones, ur >80 (*)    Leukocytes, UA SMALL (*)    All other components within normal limits  URINE MICROSCOPIC-ADD ON -  Abnormal; Notable for the following:    Bacteria, UA FEW (*)    All other components within normal limits  LIPASE, BLOOD  POC URINE PREG, ED   Imaging Review Ct Abdomen Pelvis W Contrast  07/09/2013   CLINICAL DATA:  Upper abdominal pain, nausea, vomiting and diarrhea with decreased oral intake for the past 5 days  EXAM: CT ABDOMEN AND PELVIS WITH CONTRAST  TECHNIQUE: Multidetector CT imaging of the abdomen and pelvis was performed using the standard protocol following bolus administration of intravenous contrast.  CONTRAST:  112mL OMNIPAQUE IOHEXOL 300 MG/ML  SOLN  COMPARISON:  US ABDOMEN COMPLETE dated 07/13/2008  FINDINGS: Normal hepatic contour. There is diffuse decreased attenuation of the hepatic parenchyma suggestive of hepatic steatosis. There is a minimal amount of focal fatty sparing adjacent to the fissure for ligamentum teres as well as the gallbladder fossa. No discrete hepatic lesions. Normal appearance of the gallbladder. No definite radiopaque gallstones. No intra extrahepatic dilatation. No ascites.  There is symmetric enhancement and excretion of the bilateral kidneys. Subcentimeter right-sided hypoattenuating renal lesions are too small to accurately characterize though favored to represent renal cysts. No definite renal stones on this postcontrast examination. No urinary obstruction or perinephric stranding. Normal appearance of the bilateral adrenal glands and pancreas. The spleen is enlarged measuring 15.9 cm in length. No discrete splenic lesions. No perisplenic stranding.  Scatter minimal colonic diverticulosis without evidence of diverticulitis. The bowel is otherwise normal in course and caliber without wall thickening or evidence of obstruction. Normal appearance of the appendix. No pneumoperitoneum, pneumatosis or portal venous gas.  Scattered mixed calcified and noncalcified atherosclerotic plaque with a normal caliber abdominal aorta. The major branch vessels of the abdominal  aorta are patent on this non CTA examination. Incidental note is made of a circumaortic left renal vein. The portal vein is widely patent.  Shotty porta hepatis lymph nodes with index porta hepatis nodal conglomeration measuring approximately 1.4 cm in greatest short axis diameter (image 30, series 2) presumably reactive in etiology. Otherwise, no retroperitoneal, mesenteric, pelvic or inguinal lymphadenopathy.  Note is made of a large, approximately 7.3 x 5.5 cm left-sided adnexal cyst (coronal image 76, series 4). Additionally, there is an adjacent smaller approximately 2.5 x 1.5 cm left-sided adnexal cyst (coronal image 85, series 4). No discrete right-sided adnexal lesions. There is a trace amount of presumed physiologic fluid within the endometrial canal. No free fluid within the pelvic cul-de-sac.  Limited visualization of the lower thorax demonstrates minimal subsegmental atelectasis within in the image caudal aspect of the lingula. No focal airspace opacities. No pleural effusion.  Normal heart size.  No pericardial effusion.  No acute or aggressive osseous abnormalities.  IMPRESSION: 1. No explanation for patient's abdominal pain. Specifically, no evidence of enteric or urinary obstruction. Normal appearance of  the appendix. 2. Hepatic steatosis.  Correlation with LFTs is recommended. 3. Splenomegaly, the etiology of which is not depicted on this examination. 4. Note is made of two adjacent left-sided presumably physiologic ovarian cysts, the largest of which measures approximately 7.3 cm in diameter. Given the size of the dominant cyst, further evaluation with dedicated pelvic ultrasound in 6 weeks is recommended to ensure stability and/or resolution.   Electronically Signed   By: Sandi Mariscal M.D.   On: 07/09/2013 13:59   Dg Chest Port 1 View  07/09/2013   CLINICAL DATA:  Cough  EXAM: PORTABLE CHEST - 1 VIEW  COMPARISON:  None.  FINDINGS: Cardiomediastinal silhouette is unremarkable. Mild elevation of  the right hemidiaphragm. No acute infiltrate or pleural effusion. No pulmonary edema. Mild perihilar increased bronchial markings without focal consolidation.  IMPRESSION: No acute infiltrate or pulmonary edema. Mild perihilar increased bronchial markings without focal consolidation   Electronically Signed   By: Lahoma Crocker M.D.   On: 07/09/2013 09:33  All radiology studies independently viewed by me.     EKG Interpretation   None       MDM   Final diagnoses:  Abdominal pain  Intractable nausea and vomiting    48 yo female with 4 days of epigastric pain, nausea, and diarrhea.  Labs showed minimal elevated in LFTs and mild thrombocytopenia.  Initially pain and tenderness was epigastric with minimal RUQ involvement.  On repeat exam, more tender in LUQ.  Still with severe pain and nausea.  Plan CT.    CT did not elucidate cause of pain.  No biliary dilation or gall stones to suggest biliary pathology.  Discussed with pt need to follow up her ovarian cysts (of note, she has no lower abdominal pain or tenderness).  Pt has had persistent nausea despite anti-emetics.  Unable to tolerate PO fluids.  [place] for admission.  Houston Siren III, MD 07/09/13 [phone]

## 2013-07-09 NOTE — ED Notes (Signed)
Family at bedside. 

## 2013-07-10 DIAGNOSIS — R161 Splenomegaly, not elsewhere classified: Secondary | ICD-10-CM

## 2013-07-10 DIAGNOSIS — D696 Thrombocytopenia, unspecified: Secondary | ICD-10-CM

## 2013-07-10 DIAGNOSIS — R197 Diarrhea, unspecified: Secondary | ICD-10-CM

## 2013-07-10 LAB — URINE CULTURE

## 2013-07-10 LAB — BASIC METABOLIC PANEL
BUN: 9 mg/dL (ref 6–23)
CHLORIDE: 106 meq/L (ref 96–112)
CO2: 22 mEq/L (ref 19–32)
CREATININE: 0.61 mg/dL (ref 0.50–1.10)
Calcium: 8.2 mg/dL — ABNORMAL LOW (ref 8.4–10.5)
GFR calc Af Amer: >90 mL/min (ref >90)
GFR calc non Af Amer: >90 mL/min (ref >90)
Glucose, Bld: 101 mg/dL — ABNORMAL HIGH (ref 70–99)
Potassium: 3.5 mEq/L — ABNORMAL LOW (ref 3.7–5.3)
Sodium: 140 mEq/L (ref 137–147)

## 2013-07-10 LAB — CBC
HEMATOCRIT: 36.3 % (ref 36.0–46.0)
HEMOGLOBIN: 13.4 g/dL (ref 12.0–15.0)
MCH: 33.2 pg (ref 26.0–34.0)
MCHC: 36.9 g/dL — AB (ref 30.0–36.0)
MCV: 89.9 fL (ref 78.0–100.0)
Platelets: 67 10*3/uL — ABNORMAL LOW (ref 150–400)
RBC: 4.04 MIL/uL (ref 3.87–5.11)
RDW: 13 % (ref 11.5–15.5)
WBC: 3 10*3/uL — AB (ref 4.0–10.5)

## 2013-07-10 LAB — CLOSTRIDIUM DIFFICILE BY PCR: CDIFFPCR: NEGATIVE

## 2013-07-10 MED ORDER — PANTOPRAZOLE SODIUM 40 MG IV SOLR
40.0000 mg | INTRAVENOUS | Status: DC
  Administered 2013-07-10: 40 mg via INTRAVENOUS
  Filled 2013-07-10 (×2): qty 40

## 2013-07-10 MED ORDER — MORPHINE SULFATE 2 MG/ML IJ SOLN: 2.0000 mg | Freq: Once | INTRAMUSCULAR | Status: DC

## 2013-07-10 MED ORDER — MORPHINE SULFATE 2 MG/ML IJ SOLN
1.0000 mg | INTRAMUSCULAR | Status: DC | PRN
  Administered 2013-07-10 (×2): 1 mg via INTRAVENOUS
  Filled 2013-07-10 (×2): qty 1

## 2013-07-10 MED ORDER — HYDROCODONE-ACETAMINOPHEN 5-325 MG PO TABS
1.0000 | ORAL_TABLET | ORAL | Status: DC | PRN
  Filled 2013-07-10: qty 2

## 2013-07-10 MED ORDER — GI COCKTAIL ~~LOC~~
30.0000 mL | Freq: Two times a day (BID) | ORAL | Status: DC | PRN
  Filled 2013-07-10: qty 30

## 2013-07-10 MED ORDER — WHITE PETROLATUM GEL
Status: AC
  Administered 2013-07-10: 0.2
  Filled 2013-07-10: qty 5

## 2013-07-10 MED ORDER — MORPHINE SULFATE 2 MG/ML IJ SOLN: 2.0000 mg | INTRAMUSCULAR | Status: DC | PRN

## 2013-07-10 MED ORDER — MORPHINE SULFATE 2 MG/ML IJ SOLN
2.0000 mg | Freq: Once | INTRAMUSCULAR | Status: AC
  Administered 2013-07-10: 2 mg via INTRAVENOUS
  Filled 2013-07-10: qty 1

## 2013-07-10 NOTE — Progress Notes (Signed)
FMTS ATTENDING  NOTE Ladoris Lythgoe,MD I  have seen and examined this patient, reviewed their chart. I have discussed this patient with the resident. I agree with the resident's findings, assessment and care plan. 

## 2013-07-10 NOTE — H&P (Signed)
FMTS Attending Note  I personally saw and evaluated the patient. The plan of care was discussed with the resident team. I agree with the assessment and plan as documented by the resident.   48 y/o female with PMH Rheumatoid Arthritis presents with nausea/emesis/and epigastric abdominal pain. Please refer to resident note for additional HPI. Patient states that epigastric pain is controlled this morning with morphine, nausea is improved, no further emesis this AM, has had one loose BM this morning, she reports blood tinged emesis at time of admission  Vitals: reviewed HEENT: normocephalic, PERRL, EOMI, no scleral icterus, MMM, neck supple Cardiac: RRR, S1 and S2 present, no murmurs, no heaves/thrills Resp: CTAB, normal effort Abd: soft, epigastric tenderness, no organomegally, no rebound, no guarding, bowel sounds present Skin: no rash Ext: no edema, 2+ radial pulses bilaterally  Reviewed most recent lab work and imaging.  Assessment and Plan: 48 y/o female admitted with N/V/epigastric abdominal pain. 1. Nausea/Emesis - likely secondary to viral illness, supportive therapy with IV fluids and antiemetics 2. Epigastric pain - suspect gastritis from emesis 3. Thrombocytopenia with splenomegaly - differential includes viral syndrome/ITP/chronic inflammation from RA. Agree with plan to monitor platelets and check peripheral smear. If platelets continue to drop would consider steroid burst and hematology consult. 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 Elevated liver enzymes - may be due to acute viral illness or due to NASH, monitor during hospital stay  Dossie Arbour MD

## 2013-07-10 NOTE — Progress Notes (Signed)
Pt with emesis x 1 after eating jello. Continued discomfort at an "8" on pain scale to epigastric region. Page with call back from Gray, MD for notification. N.O to epic. Will continue to monitor. Dorthey Sawyer

## 2013-07-10 NOTE — Progress Notes (Signed)
SCD' applied bilaterally.

## 2013-07-10 NOTE — Progress Notes (Signed)
Family Medicine Teaching Service Daily Progress Note Intern Pager: [phone]  Patient name: [name] Medical record number: [id number] Date of birth: March 07, 1966 Age: 48 y.o. Gender: female  Primary Care Provider: No PCP Per Patient Consultants: none Code Status: Full  Pt Overview and Major Events to Date:   Assessment and Plan: [name] is a 48 y.o. female presenting with epigastric abdominal pain with associated nausea and decreased oral intake . PMH is significant for Rheumatoid arthritis.   Dehydration secondary to nausea and decreased PO intake, Epigastric abdominal pain - DDX: Viral, gastritis, PUD. Initial workup with normal cmet (mild LFT elevation), normal lipase, CT abdomen with splenomegaly and hepatic steatosis, UA with mod hgb and small leukocytes (no c/o dysuria). Given new diarrhea likely etiology is viral gastroenteritis - Patient well appearing on exam. No leukocytosis to suggest underlying bacterial infection.  - Will treat with IV fluids and antiemetics and monitor closely for improvement.  - Clear liquid diet.  - transition morphine to oral pain meds norco - add protonix 40mg   Splenomegaly and Thrombocytopenia - Unclear etiology/incidental finding, but could be underlying RA or secondary to ITP. On presentation plts 85, this AM 67 (likely worsened by IVF dilution). If platelets continue to drop will consider steroid course and hematology consult - Will continue to monitor closely and trend CBC.   Mild hypokalemia  - K+ 3.5 on admission, repeat this AM 3.5 - Repleting with K in fluids  FEN/GI: Clear liquid diet, advance as tolerated; D5 NS w/ 20 KCl @ 100 mL/hr  Prophylaxis: SCDs  Disposition: pending clinical improvement  Subjective:  Some nausea but no vomiting. Has had multiple episodes of diarrhea overnight, did not notice any blood in the stool. Has continued pain in epigastrium, rates as 6-7/10 and currently feels like squeezing/knot, pain  is constant and worsened with movement.   Objective: Temp:  [97.4 F (36.3 C)-99.4 F (37.4 C)] 99.2 F (37.3 C) (02/27 0606) Pulse Rate:  [64-90] 84 (02/27 0606) Resp:  [12-20] 20 (02/27 0606) BP: (101-136)/(55-85) 101/62 mmHg (02/27 0606) SpO2:  [98 %-100 %] 99 % (02/27 0606) Weight:  [168 lb (76.204 kg)] 168 lb (76.204 kg) (02/26 0858) Physical Exam: General: NAD, laying in bed Cardiovascular: RRR, normal s1/s2, no murmurs Respiratory: CTAB, normal effort Abdomen: obese, soft, tender primarily to epigastrium but also LUQ. No Murphy's sign. Bowel sounds normoactive Extremities: no edema or cyanosis. WWP. Rheumatoid nodule on right forearm/elbow.  Laboratory:  Recent Labs Lab 07/09/13 0913 07/10/13 0415  WBC 4.0 3.0*  HGB 15.4* 13.4  HCT 40.9 36.3  PLT 85* 67*    Recent Labs Lab 07/09/13 0913 07/10/13 0415  NA 137 140  K 3.5* 3.5*  CL 100 106  CO2 20 22  BUN 12 9  CREATININE 0.65 0.61  CALCIUM 9.0 8.2*  PROT 8.6*  --   BILITOT 1.2  --   ALKPHOS 73  --   ALT 59*  --   AST 46*  --   GLUCOSE 104* 101*   Urine preg negative Lipase 36  UA: moderate hemoglobin, ketones >80, leukocytes small  Imaging/Diagnostic Tests:  2/26 DG Chest Port 1 view IMPRESSION:  No acute infiltrate or pulmonary edema. Mild perihilar increased  bronchial markings without focal consolidation   2/26 CT Abdomen IMPRESSION:  1. No explanation for patient's abdominal pain. Specifically, no  evidence of enteric or urinary obstruction. Normal appearance of the  appendix.  2. Hepatic steatosis. Correlation with LFTs is recommended.  3.  Splenomegaly, the etiology of which is not depicted on this  examination.  4. Note is made of two adjacent left-sided presumably physiologic  ovarian cysts, the largest of which measures approximately 7.3 cm in  diameter. Given the size of the dominant cyst, further evaluation  with dedicated pelvic ultrasound in 6 weeks is recommended to ensure   stability and/or resolution.   Tawanna Sat, MD 07/10/2013, 8:03 AM PGY-1, [place] Intern pager: [phone], text pages welcome

## 2013-07-10 NOTE — Progress Notes (Signed)
Several loose stools over night noted. Placed on enteric precautions, and will obtain specimen to r/o c-diff per protocol.

## 2013-07-10 NOTE — Progress Notes (Signed)
UR completed. Patient changed to inpatient- requiring IVF @ 100cc/hr and IV antiemetics.

## 2013-07-11 DIAGNOSIS — D696 Thrombocytopenia, unspecified: Secondary | ICD-10-CM | POA: Diagnosis present

## 2013-07-11 DIAGNOSIS — A084 Viral intestinal infection, unspecified: Secondary | ICD-10-CM | POA: Diagnosis present

## 2013-07-11 LAB — CBC
HCT: 36 % (ref 36.0–46.0)
HEMOGLOBIN: 13.2 g/dL (ref 12.0–15.0)
MCH: 32.8 pg (ref 26.0–34.0)
MCHC: 36.7 g/dL — AB (ref 30.0–36.0)
MCV: 89.3 fL (ref 78.0–100.0)
PLATELETS: 67 10*3/uL — AB (ref 150–400)
RBC: 4.03 MIL/uL (ref 3.87–5.11)
RDW: 12.8 % (ref 11.5–15.5)
WBC: 2.6 10*3/uL — ABNORMAL LOW (ref 4.0–10.5)

## 2013-07-11 LAB — COMPREHENSIVE METABOLIC PANEL
ALBUMIN: 3.5 g/dL (ref 3.5–5.2)
ALK PHOS: 62 U/L (ref 39–117)
ALT: 39 U/L — ABNORMAL HIGH (ref 0–35)
AST: 31 U/L (ref 0–37)
BUN: 5 mg/dL — ABNORMAL LOW (ref 6–23)
CALCIUM: 8.1 mg/dL — AB (ref 8.4–10.5)
CO2: 23 mEq/L (ref 19–32)
Chloride: 104 mEq/L (ref 96–112)
Creatinine, Ser: 0.59 mg/dL (ref 0.50–1.10)
GFR calc non Af Amer: >90 mL/min (ref >90)
GLUCOSE: 107 mg/dL — AB (ref 70–99)
Potassium: 3.4 mEq/L — ABNORMAL LOW (ref 3.7–5.3)
Sodium: 139 mEq/L (ref 137–147)
TOTAL PROTEIN: 7.1 g/dL (ref 6.0–8.3)
Total Bilirubin: 0.9 mg/dL (ref 0.3–1.2)

## 2013-07-11 MED ORDER — MORPHINE SULFATE 2 MG/ML IJ SOLN: 1.0000 mg | INTRAMUSCULAR | Status: DC | PRN

## 2013-07-11 MED ORDER — HYDROCODONE-ACETAMINOPHEN 5-325 MG PO TABS: 1.0000 | ORAL_TABLET | ORAL | Status: DC | PRN

## 2013-07-11 MED ORDER — ONDANSETRON HCL 4 MG PO TABS: 4.0000 mg | ORAL_TABLET | Freq: Four times a day (QID) | ORAL | Status: DC | PRN

## 2013-07-11 MED ORDER — POTASSIUM CHLORIDE CRYS ER 20 MEQ PO TBCR
40.0000 meq | EXTENDED_RELEASE_TABLET | Freq: Once | ORAL | Status: AC
  Administered 2013-07-11: 40 meq via ORAL

## 2013-07-11 MED ORDER — ONDANSETRON HCL 4 MG/2ML IJ SOLN: 4.0000 mg | Freq: Four times a day (QID) | INTRAMUSCULAR | Status: DC | PRN

## 2013-07-11 MED ORDER — PANTOPRAZOLE SODIUM 40 MG PO TBEC
40.0000 mg | DELAYED_RELEASE_TABLET | Freq: Every day | ORAL | Status: DC
  Administered 2013-07-11: 40 mg via ORAL

## 2013-07-11 NOTE — Discharge Summary (Signed)
[facility] Discharge Summary  Patient name: [name] Medical record number: [id number] Date of birth: 11-Dec-1965 Age: 48 y.o. 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 Negative Murphy's / McBurney's  Extremities: no edema, non-tender. WWP. Rheumatoid nodules noted on bilateral elbows  Neuro - awake, alert, oriented, grossly non-focal  Brief Hospital Course: [name] is a 48 y.o. female who presented with nausea / vomiting, diarrhea, epigastric abdominal pain associated with decreased oral intake. PMH is significant for Rheumatoid arthritis.  # Presumed viral gastroenteritis, with abdominal pain, n/v with secondary dehydration - Improved  Initial workup with normal CMET (mild elevated ALT 39), Lipase (36, nml), CT abdomen with splenomegaly and hepatic steatosis, UA with mod hgb and small leukocytes (no c/o dysuria). Given both vomiting / diarrhea likely etiology is viral gastroenteritis, considered less likely PUD (epigastric pain, otherwise no clinical suspicion). Admitted for IV rehydration, Zofran IV, pain control IV, Protonix, and supportive therapy.  Remained afebrile (only low grade to 99.66F). Continued to improve gradually with improved pain and tolerating clear liquids. Prior to discharge, no more vomiting / diarrhea, abdominal pain had resolved (0/10), not even requiring PO pain medicine, transitioned to regular diet, able to ambulate, sent home with rx Zofran and Norco.  # Splenomegaly, Thrombocytopenia, Leukopenia, suspected secondary to chronic inflammation (RA) # Rheumatoid Arthritis On admission, initial work-up with CBC revealed low platelets (85, note no b/l in system for comparison) and CT abdomen/pelvis showed splenomegaly, suspected to be incidental finding and unrelated to current viral episode. 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 Consider thrombocytopenia / leukopenia secondary to Enbrel treatment. Monitored platelet trend, decreased to 67 and remained stable, suspect initial decrease due to dilutional effect (IVF rehydration). No evidence of active bleeding, did not administer heparin / lovenox. Recommended re-check CBC to monitor wbc / plt counts within 1-2 weeks of discharge, advised close follow-up with Rheumatologist (Dr. Amil Amen) on discharge, also advised will likely need referral to Hematology for splenomegaly.  # Mild hypokalemia - Resolved Low K on admission at 3.5, added 43mEq KCl to IVF, persistent low K 3.5-->3.4, replaced with oral K 33mEq.  Issues for Follow Up:  1. Resolution of Viral Gastroenteritis - tolerating regular diet 2. Splenomegaly / Thrombocytopenia / Leukopenia - Recommend repeat CBC 1-2 weeks after discharge, persistently low, consider consult Hematology. 3. Rheumatology follow-up 4. Establish new PCP - CM provided resource list for PCPs, patient to check with insurance to find in network PCP, provided [facility] contact info for patient to establish as new pt next week if interested.  Significant Procedures:  none  Significant Labs and Imaging:   Recent Labs Lab 07/09/13 0913 07/10/13 0415 07/11/13 0512  WBC 4.0 3.0* 2.6*  HGB 15.4* 13.4 13.2  HCT 40.9 36.3 36.0  PLT 85*  67* 67*    Recent Labs Lab 07/09/13 0913 07/10/13 0415 07/11/13 0512  NA 137 140 139  K 3.5* 3.5* 3.4*  CL 100 106 104  CO2 20 22 23   GLUCOSE 104* 101* 107*  BUN 12 9 5*  CREATININE 0.65 0.61 0.59  CALCIUM 9.0 8.2* 8.1*  ALKPHOS 73  --  62  AST 46*  --  31  ALT 59*  --  39*  ALBUMIN 4.4  --  3.5   Urine preg negative  Lipase 36  C.Diff - negative  UA: moderate hemoglobin, ketones >80, leukocytes small  Urine culture: < 50,000 CFU, Multiple bacterial morphotypes present, none predominant.  Imaging/Diagnostic Tests:  2/26 DG Chest Port 1 view  IMPRESSION:  No acute infiltrate or pulmonary edema. Mild perihilar increased  bronchial markings without focal consolidation  2/26 CT Abdomen  IMPRESSION:  1. No explanation for patient's abdominal pain. Specifically, no  evidence of enteric or urinary obstruction. Normal appearance of the  appendix.  2. Hepatic steatosis. Correlation with LFTs is recommended.  3. Splenomegaly, the etiology of which is not depicted on this  examination.  4. Note is made of two adjacent left-sided presumably physiologic  ovarian cysts, the largest of which measures approximately 7.3 cm in  diameter. Given the size of the dominant cyst, further evaluation  with dedicated pelvic ultrasound in 6 weeks is recommended to ensure  stability and/or resolution.  Results/Tests Pending at Time of Discharge: none  Discharge Medications:    Medication List         etanercept 50 MG/ML injection  Commonly known as:  ENBREL  Inject 50 mg into the skin once a week. On Mondays     HYDROcodone-acetaminophen 5-325 MG per tablet  Commonly known as:  NORCO/VICODIN  Take 1-2 tablets by mouth every 4 (four) hours as needed for moderate pain or severe pain.     ondansetron 4 MG tablet   Commonly known as:  ZOFRAN  Take 1 tablet (4 mg total) by mouth every 6 (six) hours as needed for nausea or vomiting.        Discharge Instructions: Please refer to Patient Instructions section of EMR for full details.  Patient was counseled important signs and symptoms that should prompt return to medical care, changes in medications, dietary instructions, activity restrictions, and follow up appointments.   Follow-Up Appointments: Follow-up Information   Follow up with [facility]. Schedule an appointment as soon as possible for a visit in 1 week.   Contact information:   [place] [address] [phone]      Follow up with [facility]. (If symptoms worsen)    Specialty:  Emergency Medicine   Contact information:   [address] [id number] [place] [address] [phone]      Follow up with Hennie Duos, MD In 2 weeks. (As needed)    Specialty:  Rheumatology   Contact information:   [address] [place]  [place] [address] [phone]       Nobie Putnam, DO 07/11/2013, 4:08 PM PGY-1, [place]

## 2013-07-11 NOTE — Discharge Summary (Signed)
FMTS ATTENDING  NOTE Kehinde Eniola,MD I  have seen and examined this patient, reviewed their chart. I have discussed this patient with the resident. I agree with the resident's findings, assessment and care plan. 

## 2013-07-11 NOTE — Progress Notes (Signed)
   CARE MANAGEMENT NOTE 07/11/2013  Patient:  [name], [name]   Account Number:  [id number]  Date Initiated:  07/11/2013  Documentation initiated by:  [facility]  Subjective/Objective Assessment:   adm: abdominal pain with associated nausea and decreased oral intake discharge planning     Action/Plan:   discharge planning   Anticipated DC Date:  07/11/2013   Anticipated DC Plan:  [place]  CM consult  PCP issues      Choice offered to / List presented to:             Status of service:  Completed, signed off Medicare Important Message given?   (If response is "NO", the following Medicare IM given date fields will be blank) Date Medicare IM given:   Date Additional Medicare IM given:    Discharge Disposition:  HOME/SELF CARE  Per UR Regulation:    If discussed at Long Length of Stay Meetings, dates discussed:    Comments:  07/11/13 11:30 CM spoke with pt in room to give her a resource list to secure a PCP.  Pt has [employer] for insurance and CM recommended she look on the back of her insurance card and call the toll free number to request a PCP as [employer] may have network restrictions.  Pt verbalizes understanding of outside of network may cost more.  No other CM needs were communicated.  Mariane Masters, BSN, CM [phone].

## 2013-07-11 NOTE — Progress Notes (Signed)
Patient discharged to home. Patient AVS reviewed, and prescriptions given to patient. Patient verbalized understanding of medications and follow-up appointments.  Patient remains stable; no signs or symptoms of distress.  Patient educated to return to the ER in cases of SOB, dizziness, fever, chest pain, or fainting.

## 2013-07-11 NOTE — Progress Notes (Signed)
Family Medicine Teaching Service Daily Progress Note Intern Pager: [phone]  Patient name: [name] Medical record number: [id number] Date of birth: Jun 04, 1965 Age: 48 y.o. Gender: female  Primary Care Provider: No PCP Per Patient Consultants: none Code Status: Full  Pt Overview and Major Events to Date:   Assessment and Plan: [name] is a 48 y.o. female presenting with epigastric abdominal pain with associated nausea and decreased oral intake . PMH is significant for Rheumatoid arthritis.   # Presumed viral gastroenteritis, with abdominal pain, n/v with secondary dehydration - Improved Initial workup with normal CMET (mild elevated ALT 39), Lipase (36, nml), CT abdomen with splenomegaly and hepatic steatosis, UA with mod hgb and small leukocytes (no c/o dysuria). Given both vomiting / diarrhea likely etiology is viral gastroenteritis, considered less likely PUD (epigastric pain, otherwise no clinical suspicion). - Currently vitals stable with low-grade elevated temp (99.86F) - Reports resolved abdominal pain, nausea / last emesis (12 hrs ago) - Decreasing WBC count, leukopenia 4.0-->3.0-->2.6 - Advance diet to full liquids, cont AAT - Continue IVF for now, if tolerates advanced diet, will KVO IVF - Zofran PRN - PO / IV - Transition to Norco 5-325mg  1-2 tab q 4 hr PRN pain (DC'd Morphine 1-2mg  q 4 hr - yesterday received 6mg  in 24 hr, last dose 1mg  @ 1800) - Protonix 40mg  PO daily (DC'd IV)  # Splenomegaly and Thrombocytopenia Unclear etiology/incidental finding, but could be underlying RA or secondary to ITP. Suspect further worsening likely d/t IVF dilution. If platelets continue to drop will consider steroid course and hematology consult - Monitor with CBC - stable low Plt trend 85 --> 67 --> 67 - will consider outpatient referral to Hematology to follow-up patient on discharge - Recommend to f/u Rheum on discharge  # Mild hypokalemia  - Low K on admission  3.5 - Decreasing K trend 3.5-->3.5-->3.4 - IVF with 20 KCl mEq @ 100cc/hr - PO K 62mEq x 1 dose - f/u BMET  FEN/GI: adv to full liquid diet, advance as tolerated; D5 NS w/ 20 KCl @ 100 mL/hr (--> SLIV) Prophylaxis: SCDs  Disposition: Admit to FPTS inpatient status, continue to monitor for clinical improvement, if tolerating PO and resolution of dehydration, expect to discharge to home today vs tomorrow - Currently no local PCP. Discussed follow-up options with patient regarding establishing a PCP - c/s CM - provide packet of PCP information for patient to review, will discuss potentially following with [facility]  Subjective:  No acute events overnight. Per nursing, pt emesis x 1 after jello last night 9pm. This morning patient reports she is "a lot better" with resolved abdominal pain (0/10), she states that last night she was still experiencing significant pain requiring Morphine IV (last dose 6pm), but currently no pain and has not needed Morphine this morning. Admits to tolerating clear liquids with broth and jello (believes she vomited d/t pain, not jello last night). Improved appetite, eager to try more advanced diet this morning and for lunch. Feels like she might be ready to go home later today.  Objective: Temp:  [97.9 F (36.6 C)-99.7 F (37.6 C)] 99.7 F (37.6 C) (02/28 0802) Pulse Rate:  [66-81] 81 (02/28 0802) Resp:  [18-19] 19 (02/28 0802) BP: (104-131)/(54-81) 116/64 mmHg (02/28 0802) SpO2:  [96 %-99 %] 99 % (02/28 0802) Physical Exam: General: pleasant / conversational, laying in bed, NAD Cardiovascular: RRR, no murmurs Respiratory: CTAB, normal WOB Abdomen: soft, NT (improved, yesterday tender to epigastric / LUQ), non-distended,  no masses, no rebound or guarding, +active BS. Negative Murphy's / McBurney's Extremities: no edema, non-tender. WWP. Rheumatoid nodules noted on bilateral elbows Neuro - awake, alert, oriented, grossly non-focal  Laboratory:  Recent Labs Lab  07/09/13 0913 07/10/13 0415 07/11/13 0512  WBC 4.0 3.0* 2.6*  HGB 15.4* 13.4 13.2  HCT 40.9 36.3 36.0  PLT 85* 67* 67*    Recent Labs Lab 07/09/13 0913 07/10/13 0415 07/11/13 0512  NA 137 140 139  K 3.5* 3.5* 3.4*  CL 100 106 104  CO2 20 22 23   BUN 12 9 5*  CREATININE 0.65 0.61 0.59  CALCIUM 9.0 8.2* 8.1*  PROT 8.6*  --  7.1  BILITOT 1.2  --  0.9  ALKPHOS 73  --  62  ALT 59*  --  39*  AST 46*  --  31  GLUCOSE 104* 101* 107*   Urine preg negative Lipase 36 C.Diff - negative  UA: moderate hemoglobin, ketones >80, leukocytes small Urine culture: < 50,000 CFU, Multiple bacterial morphotypes present, none predominant.  Imaging/Diagnostic Tests:  2/26 DG Chest Port 1 view IMPRESSION:  No acute infiltrate or pulmonary edema. Mild perihilar increased  bronchial markings without focal consolidation   2/26 CT Abdomen IMPRESSION:  1. No explanation for patient's abdominal pain. Specifically, no  evidence of enteric or urinary obstruction. Normal appearance of the  appendix.  2. Hepatic steatosis. Correlation with LFTs is recommended.  3. Splenomegaly, the etiology of which is not depicted on this  examination.  4. Note is made of two adjacent left-sided presumably physiologic  ovarian cysts, the largest of which measures approximately 7.3 cm in  diameter. Given the size of the dominant cyst, further evaluation  with dedicated pelvic ultrasound in 6 weeks is recommended to ensure  stability and/or resolution.   Nobie Putnam, DO 07/11/2013, 9:33 AM PGY-1, [address] Intern pager: [phone], text pages welcome

## 2013-07-11 NOTE — Progress Notes (Signed)
FMTS ATTENDING NOTE Brandi Baby,MD I  have seen and examined this patient, reviewed their chart. I have discussed this patient with the resident. I agree with the resident's findings, assessment and care plan.  Patient is feeling much better, denies any pain today,she has been able to keep down her meal, no diarrhea, she denies any bleeding or bruising regarding her thrombocytopenia. Patient can be d/c today to follow up with PCP to refer to hematology for assessment of her thrombocytopenia in the setting of her splenomegaly.

## 2013-07-11 NOTE — Discharge Instructions (Signed)
You were hospitalized due to dehydration from nausea / vomiting / diarrhea. Primary treatment with IV fluid rehydration, anti-nausea (Zofran), and pain medicine IV (Morphine). Overall, we suspect that this was all caused by a viral gastroenteritis. It may take a few more days for your GI system to return to normal. As long as you are tolerating plenty of liquids and some food, you will continue to improve at home. We will provide you with a prescription for Zofran and Norco (Hydrocodone, pain medicine) to go home with.  Additionally, we performed a CT scan of your abdomen, and found that your Spleen is enlarged, also with Ovarian Cysts. We suspect that your Rheumatoid Arthritis, may be causing your spleen to be large as well as your platelets to be low. At this point, we would highly recommend re-checking your blood count (CBC) to monitor your low platelets within 1-2 weeks after you go home, this is important to discuss with your primary doctor. - We recommend that you get a referral to see a Hematology doctor (regarding your spleen and platelets) as well  If your symptoms significantly return with worse abdominal pain / persistent vomiting / diarrhea / unable to hold down any food or liquids / fevers/chills, then please call your doctor's office or seek urgent medical attention at Urgent Care or Emergency Department.  Viral Gastroenteritis Viral gastroenteritis is also known as stomach flu. This condition affects the stomach and intestinal tract. It can cause sudden diarrhea and vomiting. The illness typically lasts 3 to 8 days. Most people develop an immune response that eventually gets rid of the virus. While this natural response develops, the virus can make you quite ill. CAUSES  Many different viruses can cause gastroenteritis, such as rotavirus or noroviruses. You can catch one of these viruses by consuming contaminated food or water. You may also catch a virus by sharing utensils or other personal  items with an infected person or by touching a contaminated surface. SYMPTOMS  The most common symptoms are diarrhea and vomiting. These problems can cause a severe loss of body fluids (dehydration) and a body salt (electrolyte) imbalance. Other symptoms may include:  Fever.  Headache.  Fatigue.  Abdominal pain. DIAGNOSIS  Your caregiver can usually diagnose viral gastroenteritis based on your symptoms and a physical exam. A stool sample may also be taken to test for the presence of viruses or other infections. TREATMENT  This illness typically goes away on its own. Treatments are aimed at rehydration. The most serious cases of viral gastroenteritis involve vomiting so severely that you are not able to keep fluids down. In these cases, fluids must be given through an intravenous line (IV). HOME CARE INSTRUCTIONS   Drink enough fluids to keep your urine clear or pale yellow. Drink small amounts of fluids frequently and increase the amounts as tolerated.  Ask your caregiver for specific rehydration instructions.  Avoid:  Foods high in sugar.  Alcohol.  Carbonated drinks.  Tobacco.  Juice.  Caffeine drinks.  Extremely hot or cold fluids.  Fatty, greasy foods.  Too much intake of anything at one time.  Dairy products until 24 to 48 hours after diarrhea stops.  You may consume probiotics. Probiotics are active cultures of beneficial bacteria. They may lessen the amount and number of diarrheal stools in adults. Probiotics can be found in yogurt with active cultures and in supplements.  Wash your hands well to avoid spreading the virus.  Only take over-the-counter or prescription medicines for pain, discomfort, or  fever as directed by your caregiver. Do not give aspirin to children. Antidiarrheal medicines are not recommended.  Ask your caregiver if you should continue to take your regular prescribed and over-the-counter medicines.  Keep all follow-up appointments as  directed by your caregiver. SEEK IMMEDIATE MEDICAL CARE IF:   You are unable to keep fluids down.  You do not urinate at least once every 6 to 8 hours.  You develop shortness of breath.  You notice blood in your stool or vomit. This may look like coffee grounds.  You have abdominal pain that increases or is concentrated in one small area (localized).  You have persistent vomiting or diarrhea.  You have a fever.  The patient is a child younger than 3 months, and he or she has a fever.  The patient is a child older than 3 months, and he or she has a fever and persistent symptoms.  The patient is a child older than 3 months, and he or she has a fever and symptoms suddenly get worse.  The patient is a baby, and he or she has no tears when crying. MAKE SURE YOU:   Understand these instructions.  Will watch your condition.  Will get help right away if you are not doing well or get worse. Document Released: 04/30/2005 Document Revised: 07/23/2011 Document Reviewed: 02/14/2011 [facility] Patient Information 2014 [place].  Thrombocytopenia Thrombocytopenia is a condition in which there is an abnormally small number of platelets in your blood. Platelets are also called thrombocytes. Platelets are needed for blood clotting. CAUSES Thrombocytopenia is caused by:   Decreased production of platelets. This can be caused by:  Aplastic anemia in which your bone marrow quits making blood cells.  Cancer in the bone marrow.  Use of certain medicines, including chemotherapy.  Infection in the bone marrow.  Heavy alcohol consumption.  Increased destruction of platelets. This can be caused by:  Certain immune diseases.  Use of certain drugs.  Certain blood clotting disorders.  Certain inherited disorders.  Certain bleeding disorders.  Pregnancy.  Having an enlarged spleen (hypersplenism). In hypersplenism, the spleen gathers up platelets from circulation. This means  the platelets are not available to help with blood clotting. The spleen can enlarge due to cirrhosis or other conditions. SYMPTOMS  The symptoms of thrombocytopenia are side effects of poor blood clotting. Some of these are:  Abnormal bleeding.  Nosebleeds.  Heavy menstrual periods.  Blood in the urine or stools.  Purpura. This is a purplish discoloration in the skin produced by small bleeding vessels near the surface of the skin.  Bruising.  A rash that may be petechial. This looks like pinpoint, purplish-red spots on the skin and mucous membranes. It is caused by bleeding from small blood vessels (capillaries). DIAGNOSIS  Your caregiver will make this diagnosis based on your exam and blood tests. Sometimes, a bone marrow study is done to look for the original cells (megakaryocytes) that make platelets. TREATMENT  Treatment depends on the cause of the condition.  Medicines may be given to help protect your platelets from being destroyed.  In some cases, a replacement (transfusion) of platelets may be required to stop or prevent bleeding.  Sometimes, the spleen must be surgically removed. HOME CARE INSTRUCTIONS   Check the skin and linings inside your mouth for bruising or bleeding as directed by your caregiver.  Check your sputum, urine, and stool for blood as directed by your caregiver.  Do not return to any activities that could cause  bumps or bruises until your caregiver says it is okay.  Take extra care not to cut yourself when shaving or when using scissors, needles, knives, and other tools.  Take extra care not to burn yourself when ironing or cooking.  Ask your caregiver if it is okay for you to drink alcohol.  Only take over-the-counter or prescription medicines as directed by your caregiver.  Notify all your caregivers, including dentists and eye doctors, about your condition. SEEK IMMEDIATE MEDICAL CARE IF:   You develop active bleeding from anywhere in your  body.  You develop unexplained bruising or bleeding.  You have blood in your sputum, urine, or stool. MAKE SURE YOU:  Understand these instructions.  Will watch your condition.  Will get help right away if you are not doing well or get worse. Document Released: 04/30/2005 Document Revised: 07/23/2011 Document Reviewed: 03/02/2011 [facility] Patient Information 2014 [place], [place].

## 2013-07-13 MED ORDER — ONDANSETRON HCL 4 MG PO TABS: 4.0000 mg | ORAL_TABLET | Freq: Four times a day (QID) | ORAL | Status: DC | PRN

## 2013-07-13 MED ORDER — HYDROCODONE-ACETAMINOPHEN 5-325 MG PO TABS: 1.0000 | ORAL_TABLET | ORAL | Status: DC | PRN

## 2013-07-13 NOTE — Progress Notes (Signed)
Post-discharge note  Discharge date: 07/11/2013  Received call from nurse that patient called floor today 07/13/2013 stating that she had lost her prescriptions for vicodin and zofran; patient does not currently have a PCP. Discussed with Dr. Andria Frames that it would be okay to re-print the prescriptions for the patient to pickup. Patient advised that this is a one time occurrence, and if she is still having enough pain requiring narcotics beyond this prescription she needs to be re-evaluated by a PCP or the ED.  Tawanna Sat, MD 07/13/2013, 2:43 PM PGY-1, [place] Intern Pager: [phone], text pages welcome

## 2013-08-20 ENCOUNTER — Ambulatory Visit: Payer: 59 | Admitting: Family Medicine

## 2013-09-08 ENCOUNTER — Ambulatory Visit (INDEPENDENT_AMBULATORY_CARE_PROVIDER_SITE_OTHER): Payer: 59 | Admitting: Family Medicine

## 2013-09-08 ENCOUNTER — Encounter: Payer: Self-pay | Admitting: Family Medicine

## 2013-09-08 VITALS — BP 113/75 | HR 85 | Temp 98.7°F | Ht 64.5 in | Wt 164.0 lb

## 2013-09-08 DIAGNOSIS — E288 Other ovarian dysfunction: Secondary | ICD-10-CM

## 2013-09-08 DIAGNOSIS — M069 Rheumatoid arthritis, unspecified: Secondary | ICD-10-CM

## 2013-09-08 DIAGNOSIS — Z13220 Encounter for screening for lipoid disorders: Secondary | ICD-10-CM

## 2013-09-08 DIAGNOSIS — E2839 Other primary ovarian failure: Secondary | ICD-10-CM | POA: Insufficient documentation

## 2013-09-08 DIAGNOSIS — M26649 Arthritis of unspecified temporomandibular joint: Secondary | ICD-10-CM | POA: Insufficient documentation

## 2013-09-08 DIAGNOSIS — M26609 Unspecified temporomandibular joint disorder, unspecified side: Secondary | ICD-10-CM

## 2013-09-08 DIAGNOSIS — R7309 Other abnormal glucose: Secondary | ICD-10-CM

## 2013-09-08 DIAGNOSIS — M0579 Rheumatoid arthritis with rheumatoid factor of multiple sites without organ or systems involvement: Secondary | ICD-10-CM | POA: Insufficient documentation

## 2013-09-08 LAB — POCT UA - MICROSCOPIC ONLY

## 2013-09-08 LAB — POCT URINALYSIS DIPSTICK
Bilirubin, UA: NEGATIVE
GLUCOSE UA: NEGATIVE
Ketones, UA: NEGATIVE
NITRITE UA: NEGATIVE
PROTEIN UA: NEGATIVE
Spec Grav, UA: 1.025
UROBILINOGEN UA: 2
pH, UA: 6.5

## 2013-09-08 LAB — POCT URINE PREGNANCY: PREG TEST UR: NEGATIVE

## 2013-09-08 LAB — POCT GLYCOSYLATED HEMOGLOBIN (HGB A1C): HEMOGLOBIN A1C: 4.8

## 2013-09-08 MED ORDER — CYCLOBENZAPRINE HCL 5 MG PO TABS: 5.0000 mg | ORAL_TABLET | Freq: Every evening | ORAL | Status: DC | PRN

## 2013-09-08 MED ORDER — NAPROXEN 500 MG PO TABS: 500.0000 mg | ORAL_TABLET | Freq: Two times a day (BID) | ORAL | Status: DC

## 2013-09-08 NOTE — Progress Notes (Signed)
[name] is a 48 y.o. who presents today for establishing care.  Pt has PMHx of Rheumatoid Arthritis.  This is managed by Dr. Dorothey Baseman in [place], on Enbrel which works well for her.  She does have some joint pain, specifically in her wrists and hands and has had this since she was a child.  Denies any new Sx.  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  States her last peroid was around the age of 25 and has not had any spotting since then.  No family or personal history of breast, uterine, or ovarian failure.   TMJ Pain - Has been ongoing for the past couple of weeks.  Has seen her dentist who thought it may be sinus related.  Has not had any fevers, sinus pressure or pain, or history of sinus problems.  Pain worse with jaw opening or mastication and worse at night.  Has not tried anything for this.   Past Medical History  Diagnosis Date  . Ectopic pregnancy   . Kidney stones     "passed them"  . Rheumatoid arthritis     History   Social History  . Marital Status: Married    Spouse Name: N/A    Number of Children: N/A  . Years of Education: N/A   Occupational History  . Not on file.   Social History Main Topics  . Smoking status: Current Some Day Smoker  . Smokeless tobacco: Never Used  . Alcohol Use: No  . Drug Use: No  . Sexual Activity: Not Currently   Other Topics Concern  . Not on file   Social History Narrative  . No narrative on file    No family history on file.  Current Outpatient Prescriptions on File Prior to Visit  Medication Sig Dispense Refill  . etanercept (ENBREL) 50 MG/ML injection Inject 50 mg into the skin once a week. On Mondays      . HYDROcodone-acetaminophen (NORCO/VICODIN) 5-325 MG per tablet Take 1-2 tablets by mouth every 4 (four) hours as needed for moderate pain or severe pain.  20 tablet  0  . ondansetron (ZOFRAN) 4 MG tablet Take 1 tablet (4 mg total) by mouth every 6 (six) hours as needed for  nausea or vomiting.  20 tablet  0   No current facility-administered medications on file prior to visit.    Patient Information Form: Screening and ROS  AUDIT-C Score: 2 Do you feel safe in relationships? yes PHQ-2:negative  Review of Symptoms  General:  Negative for nexplained weight loss, fever Skin: Negative for new or changing mole, sore that won't heal HEENT: Negative for trouble hearing, trouble seeing, ringing in ears, mouth sores, hoarseness, change in voice, dysphagia. CV:  Negative for chest pain, dyspnea, edema, palpitations Resp: Negative for cough, dyspnea, hemoptysis GI: Negative for nausea, vomiting, diarrhea, constipation, abdominal pain, melena, hematochezia. GU: Negative for dysuria, incontinence, urinary hesitance, hematuria, vaginal or penile discharge, polyuria, sexual difficulty, lumps in testicle or breasts MSK: + for muscle cramps or aches, joint pain or swelling Neuro: Negative for headaches, weakness, numbness, dizziness, passing out/fainting Psych: Negative for depression, anxiety, memory problems  Physical Exam Filed Vitals:   09/08/13 1343  BP: 113/75  Pulse: 85  Temp: 98.7 F (37.1 C)    Gen: NAD, Well nourished, Well developed HEENT: PERLA, EOMI, [place]/AT, TMI B/L, MMM, nares patent  Neck: no JVD, no LAD Cardio: RRR, No murmurs/gallops/rubs Lungs: CTA, no wheezes, rhonchi, crackles Abd:  NABS, soft nontender nondistended MSK: ROM normal, no PIP or MCP joint abnormality  Neuro: CN 2-12 intact, MS 5/5 B/L UE and LE, +2 patellar and achilles relfex b/l Psych: AAO x 3, normal mood, affect, behavior, and dress Skin: + Rheumatoid Nodules 2 x 2 cm (total of 2) on R elbow and 1 x 1 cm nodule on L elbow.  No rashes, moles, suspicious lesions      Chemistry      Component Value Date/Time   NA 139 07/11/2013 0512   K 3.4* 07/11/2013 0512   CL 104 07/11/2013 0512   CO2 23 07/11/2013 0512   BUN 5* 07/11/2013 0512   CREATININE 0.59 07/11/2013 0512       Component Value Date/Time   CALCIUM 8.1* 07/11/2013 0512   ALKPHOS 62 07/11/2013 0512   AST 31 07/11/2013 0512   ALT 39* 07/11/2013 0512   BILITOT 0.9 07/11/2013 0512      Lab Results  Component Value Date   WBC 2.6* 07/11/2013   HGB 13.2 07/11/2013   HCT 36.0 07/11/2013   MCV 89.3 07/11/2013   PLT 67* 07/11/2013

## 2013-09-08 NOTE — Addendum Note (Signed)
Addended by: Levert Feinstein F on: 09/08/2013 02:49 PM   Modules accepted: Orders

## 2013-09-08 NOTE — Patient Instructions (Addendum)
[name], it was nice seeing you today.  Please start taking the naprosyn two times per day and the flexeril at night as needed.  As well, we will get lab work on you today and will see you back in two weeks.  Please do the exercises for your jaw as we discussed as well.  Thanks, Dr. Awanda Mink   Temporomandibular Problems  Temporomandibular joint (TMJ) dysfunction means there are problems with the joint between your jaw and your skull. This is a joint lined by cartilage like other joints in your body but also has a small disc in the joint which keeps the bones from rubbing on each other. These joints are like other joints and can get inflamed (sore) from arthritis and other problems. When this joint gets sore, it can cause headaches and pain in the jaw and the face. CAUSES  Usually the arthritic types of problems are caused by soreness in the joint. Soreness in the joint can also be caused by overuse. This may come from grinding your teeth. It may also come from mis-alignment in the joint. DIAGNOSIS Diagnosis of this condition can often be made by history and exam. Sometimes your caregiver may need X-rays or an MRI scan to determine the exact cause. It may be necessary to see your dentist to determine if your teeth and jaws are lined up correctly. TREATMENT  Most of the time this problem is not serious; however, sometimes it can persist (become chronic). When this happens medications that will cut down on inflammation (soreness) help. Sometimes a shot of cortisone into the joint will be helpful. If your teeth are not aligned it may help for your dentist to make a splint for your mouth that can help this problem. If no physical problems can be found, the problem may come from tension. If tension is found to be the cause, biofeedback or relaxation techniques may be helpful. HOME CARE INSTRUCTIONS   Later in the day, applications of ice packs may be helpful. Ice can be used in a plastic bag with a towel  around it to prevent frostbite to skin. This may be used about every 2 hours for 20 to 30 minutes, as needed while awake, or as directed by your caregiver.  Only take over-the-counter or prescription medicines for pain, discomfort, or fever as directed by your caregiver.  If physical therapy was prescribed, follow your caregiver's directions.  Wear mouth appliances as directed if they were given. Document Released: 01/23/2001 Document Revised: 07/23/2011 Document Reviewed: 05/02/2008 [facility] Patient Information 2014 [place], [place].

## 2013-09-08 NOTE — Assessment & Plan Note (Signed)
Most likely 2/2 Rheumatoid disease.  Will try short term course of Naprosyn and Flexeril and see back in two weeks, along with jaw exercises.  If improvement, will continue either flexeril PRN or low dose TCA.  If no improvement, consider mouth guard per dentistry.

## 2013-09-08 NOTE — Addendum Note (Signed)
Addended by: [place], Berley Gambrell on: 09/08/2013 03:13 PM   Modules accepted: Orders

## 2013-09-08 NOTE — Assessment & Plan Note (Addendum)
Pt has not had her menses since the age of 25 and has not been worked up for this in the past.  Does not have marfanoid or Turner habitus as well.  Will go ahead and perform w/u for this including LH/FSH/Estradiol, Transvaginal US, DEXA scan (marijuana smoker daily).  F/U in two weeks to discuss results.  If results equivocal, consider primary adrenal insufficiency with testing for serum anti-adrenal and anti-21 hydroxylase antibodies.  Will get UA and U preg, even if these are extremely low yield in this pt.

## 2013-09-09 ENCOUNTER — Other Ambulatory Visit: Payer: Self-pay | Admitting: Family Medicine

## 2013-09-09 DIAGNOSIS — E288 Other ovarian dysfunction: Principal | ICD-10-CM

## 2013-09-09 DIAGNOSIS — Z13820 Encounter for screening for osteoporosis: Secondary | ICD-10-CM

## 2013-09-09 DIAGNOSIS — E2839 Other primary ovarian failure: Secondary | ICD-10-CM

## 2013-09-09 LAB — COMPREHENSIVE METABOLIC PANEL
ALT: 41 U/L — ABNORMAL HIGH (ref 0–35)
AST: 25 U/L (ref 0–37)
Albumin: 4.2 g/dL (ref 3.5–5.2)
Alkaline Phosphatase: 65 U/L (ref 39–117)
BUN: 11 mg/dL (ref 6–23)
CALCIUM: 9 mg/dL (ref 8.4–10.5)
CHLORIDE: 104 meq/L (ref 96–112)
CO2: 25 mEq/L (ref 19–32)
Creat: 0.65 mg/dL (ref 0.50–1.10)
GLUCOSE: 91 mg/dL (ref 70–99)
Potassium: 3.9 mEq/L (ref 3.5–5.3)
SODIUM: 137 meq/L (ref 135–145)
Total Bilirubin: 1.1 mg/dL (ref 0.2–1.2)
Total Protein: 7.6 g/dL (ref 6.0–8.3)

## 2013-09-09 LAB — CBC WITH DIFFERENTIAL/PLATELET
BASOS PCT: 0 % (ref 0–1)
Basophils Absolute: 0 10*3/uL (ref 0.0–0.1)
EOS ABS: 0.1 10*3/uL (ref 0.0–0.7)
Eosinophils Relative: 2 % (ref 0–5)
HCT: 39.6 % (ref 36.0–46.0)
HEMOGLOBIN: 14.2 g/dL (ref 12.0–15.0)
Lymphocytes Relative: 44 % (ref 12–46)
Lymphs Abs: 2.2 10*3/uL (ref 0.7–4.0)
MCH: 31.8 pg (ref 26.0–34.0)
MCHC: 35.9 g/dL (ref 30.0–36.0)
MCV: 88.6 fL (ref 78.0–100.0)
MONOS PCT: 11 % (ref 3–12)
Monocytes Absolute: 0.5 10*3/uL (ref 0.1–1.0)
NEUTROS ABS: 2.1 10*3/uL (ref 1.7–7.7)
NEUTROS PCT: 43 % (ref 43–77)
Platelets: 120 10*3/uL — ABNORMAL LOW (ref 150–400)
RBC: 4.47 MIL/uL (ref 3.87–5.11)
RDW: 13.3 % (ref 11.5–15.5)
WBC: 4.9 10*3/uL (ref 4.0–10.5)

## 2013-09-09 LAB — LIPID PANEL
Cholesterol: 152 mg/dL (ref 0–200)
HDL: 35 mg/dL — ABNORMAL LOW (ref >39)
LDL Cholesterol: 94 mg/dL (ref 0–99)
Total CHOL/HDL Ratio: 4.3 Ratio
Triglycerides: 115 mg/dL (ref <150)
VLDL: 23 mg/dL (ref 0–40)

## 2013-09-10 LAB — FSH/LH
FSH: 57.8 m[IU]/mL
LH: 35.2 m[IU]/mL

## 2013-09-10 LAB — ESTRADIOL: Estradiol: 16.7 pg/mL

## 2013-09-16 ENCOUNTER — Inpatient Hospital Stay (HOSPITAL_COMMUNITY): Admission: RE | Admit: 2013-09-16 | Payer: 59 | Source: Ambulatory Visit

## 2013-09-16 ENCOUNTER — Ambulatory Visit (HOSPITAL_COMMUNITY): Admission: RE | Admit: 2013-09-16 | Payer: 59 | Source: Ambulatory Visit

## 2013-09-23 ENCOUNTER — Ambulatory Visit (HOSPITAL_COMMUNITY)
Admission: RE | Admit: 2013-09-23 | Discharge: 2013-09-23 | Disposition: A | Discharge status: Home / Self Care | Payer: 59 | Source: Ambulatory Visit | Attending: Family Medicine | Admitting: Family Medicine

## 2013-09-23 DIAGNOSIS — Z13820 Encounter for screening for osteoporosis: Secondary | ICD-10-CM

## 2013-09-23 DIAGNOSIS — E288 Other ovarian dysfunction: Principal | ICD-10-CM

## 2013-09-23 DIAGNOSIS — N839 Noninflammatory disorder of ovary, fallopian tube and broad ligament, unspecified: Secondary | ICD-10-CM | POA: Insufficient documentation

## 2013-09-23 DIAGNOSIS — E2839 Other primary ovarian failure: Secondary | ICD-10-CM

## 2013-09-23 DIAGNOSIS — N9489 Other specified conditions associated with female genital organs and menstrual cycle: Secondary | ICD-10-CM | POA: Insufficient documentation

## 2013-09-24 ENCOUNTER — Telehealth: Payer: Self-pay | Admitting: Family Medicine

## 2013-09-24 NOTE — Telephone Encounter (Signed)
Pelvic/Transvaginal US concerning for malignant ovarian mass.  Called pt and set up appointment next Thursday the 21st to discuss her results.  Also discussed case with Dr. Nori Riis and Dr. Kennon Rounds who recommend getting CA 125 and if elevated, sending to gyn onc.  If low, can be evaluated by gynecology for surgical removal and bx.    Tamela Oddi Awanda Mink, DO of [facility] 09/24/2013, 8:52 AM

## 2013-10-01 ENCOUNTER — Ambulatory Visit (INDEPENDENT_AMBULATORY_CARE_PROVIDER_SITE_OTHER): Payer: 59 | Admitting: Family Medicine

## 2013-10-01 ENCOUNTER — Encounter: Payer: Self-pay | Admitting: Family Medicine

## 2013-10-01 VITALS — BP 111/70 | HR 79 | Temp 98.9°F | Ht 64.5 in | Wt 164.0 lb

## 2013-10-01 DIAGNOSIS — N839 Noninflammatory disorder of ovary, fallopian tube and broad ligament, unspecified: Secondary | ICD-10-CM

## 2013-10-01 DIAGNOSIS — N838 Other noninflammatory disorders of ovary, fallopian tube and broad ligament: Secondary | ICD-10-CM

## 2013-10-01 NOTE — Progress Notes (Signed)
[name] is a 48 y.o. female who presents today for [place] follow up results  Pt with premature ovarian failure, work up started last visit at end of April.  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  Denies any vaginal bleeding or abdominal pain.     Past Medical History  Diagnosis Date  . Ectopic pregnancy   . Kidney stones     "passed them"  . Rheumatoid arthritis     History  Smoking status  . Never Smoker   Smokeless tobacco  . Never Used    Family History  Problem Relation Age of Onset  . Arthritis Mother   . Hypertension Mother   . Cancer Neg Hx     Current Outpatient Prescriptions on File Prior to Visit  Medication Sig Dispense Refill  . cyclobenzaprine (FLEXERIL) 5 MG tablet Take 1 tablet (5 mg total) by mouth at bedtime as needed for muscle spasms (For jaw pain).  30 tablet  0  . etanercept (ENBREL) 50 MG/ML injection Inject 50 mg into the skin once a week. On Mondays      . naproxen (NAPROSYN) 500 MG tablet Take 1 tablet (500 mg total) by mouth 2 (two) times daily with a meal.  30 tablet  0   No current facility-administered medications on file prior to visit.    ROS: Per HPI.  All other systems reviewed and are negative.   Physical Exam Filed Vitals:   10/01/13 1043  BP: 111/70  Pulse: 79  Temp: 98.9 F (37.2 C)    Physical Examination: General appearance - alert, well appearing, and in no distress Heart - normal rate and regular rhythm, no murmurs noted Abdomen - soft, nontender, nondistended, no masses or organomegaly    Chemistry      Component Value Date/Time   NA 137 09/08/2013 1414   K 3.9 09/08/2013 1414   CL 104 09/08/2013 1414   CO2 25 09/08/2013 1414   BUN 11 09/08/2013 1414   CREATININE 0.65 09/08/2013 1414   CREATININE 0.59 07/11/2013 0512      Component Value Date/Time   CALCIUM 9.0 09/08/2013 1414   ALKPHOS 65 09/08/2013 1414   AST 25 09/08/2013 1414   ALT 41* 09/08/2013 1414   BILITOT 1.1 09/08/2013 1414     US Pelvis -  IMPRESSION:  7.7 cm complex cystic adnexal mass which is highly suspicious for  cystic ovarian neoplasm. Some thickened septations are visualized,  which are worrisome for possible malignancy. Surgical evaluation is  recommended.

## 2013-10-01 NOTE — Assessment & Plan Note (Signed)
W/U for premature ovarian failure revealed 7.7 cm complex cystic adnexal mass which is highly suspicious for cystic ovarian neoplasm.  Discussed case with Dr. Kennon Rounds, OB-GYN, who recommended obtaining a CA-125 and if elevated to send to gyn-onc or if normal, to refer to regular gynecology for surgical removal.  Will order CA 125 today and referral based on results.

## 2013-10-01 NOTE — Patient Instructions (Signed)
[name], I will call you with your results in the next couple of days.  If you do not hear from me within one week, please let me know.  Thanks, Dr. Awanda Mink

## 2013-10-02 ENCOUNTER — Telehealth: Payer: Self-pay | Admitting: Family Medicine

## 2013-10-02 DIAGNOSIS — N83209 Unspecified ovarian cyst, unspecified side: Secondary | ICD-10-CM

## 2013-10-02 LAB — CA 125: CA 125: 4 U/mL (ref 0.0–30.2)

## 2013-10-02 NOTE — Telephone Encounter (Signed)
Called to let her know the results of her CA 125.  Will refer to gynecology for further evaluation and surgical removal w/ Bx.  Explained to pt the results and next step, no questions.  Thanks, Tamela Oddi. Awanda Mink, DO of [facility] 10/02/2013, 8:38 AM

## 2013-10-14 ENCOUNTER — Encounter: Payer: Self-pay | Admitting: Family Medicine

## 2013-10-14 ENCOUNTER — Ambulatory Visit (INDEPENDENT_AMBULATORY_CARE_PROVIDER_SITE_OTHER): Payer: 59 | Admitting: Family Medicine

## 2013-10-14 VITALS — BP 139/82 | HR 83 | Temp 98.3°F | Wt 163.0 lb

## 2013-10-14 DIAGNOSIS — J309 Allergic rhinitis, unspecified: Secondary | ICD-10-CM

## 2013-10-14 DIAGNOSIS — D696 Thrombocytopenia, unspecified: Secondary | ICD-10-CM

## 2013-10-14 DIAGNOSIS — R21 Rash and other nonspecific skin eruption: Secondary | ICD-10-CM

## 2013-10-14 LAB — CBC
HEMATOCRIT: 39.9 % (ref 36.0–46.0)
HEMOGLOBIN: 14.2 g/dL (ref 12.0–15.0)
MCH: 32 pg (ref 26.0–34.0)
MCHC: 35.6 g/dL (ref 30.0–36.0)
MCV: 89.9 fL (ref 78.0–100.0)
Platelets: 136 10*3/uL — ABNORMAL LOW (ref 150–400)
RBC: 4.44 MIL/uL (ref 3.87–5.11)
RDW: 13.4 % (ref 11.5–15.5)
WBC: 4.8 10*3/uL (ref 4.0–10.5)

## 2013-10-14 NOTE — Patient Instructions (Signed)
Thank you for coming in,   Lets try flonase 2 puffs twice a day for two weeks. You may try once a day after that. You may also add an allegra to a daily regimen.   Please follow up with your Rheumatologist to see if the bruising may be a drug reaction to the embrel.   I will call you with the lab work from today.   Follow up with me in one week to see if the rash is improved.    Please feel free to call with any questions or concerns at any time, at [phone]. --Dr. Raeford Razor  Allergic Rhinitis Allergic rhinitis is when the mucous membranes in the nose respond to allergens. Allergens are particles in the air that cause your body to have an allergic reaction. This causes you to release allergic antibodies. Through a chain of events, these eventually cause you to release histamine into the blood stream. Although meant to protect the body, it is this release of histamine that causes your discomfort, such as frequent sneezing, congestion, and an itchy, runny nose.  CAUSES  Seasonal allergic rhinitis (hay fever) is caused by pollen allergens that may come from grasses, trees, and weeds. Year-round allergic rhinitis (perennial allergic rhinitis) is caused by allergens such as house dust mites, pet dander, and mold spores.  SYMPTOMS   Nasal stuffiness (congestion).  Itchy, runny nose with sneezing and tearing of the eyes. DIAGNOSIS  Your health care provider can help you determine the allergen or allergens that trigger your symptoms. If you and your health care provider are unable to determine the allergen, skin or blood testing may be used. TREATMENT  Allergic Rhinitis does not have a cure, but it can be controlled by:  Medicines and allergy shots (immunotherapy).  Avoiding the allergen. Hay fever may often be treated with antihistamines in pill or nasal spray forms. Antihistamines block the effects of histamine. There are over-the-counter medicines that may help with nasal congestion and  swelling around the eyes. Check with your health care provider before taking or giving this medicine.  If avoiding the allergen or the medicine prescribed do not work, there are many new medicines your health care provider can prescribe. Stronger medicine may be used if initial measures are ineffective. Desensitizing injections can be used if medicine and avoidance does not work. Desensitization is when a patient is given ongoing shots until the body becomes less sensitive to the allergen. Make sure you follow up with your health care provider if problems continue. HOME CARE INSTRUCTIONS It is not possible to completely avoid allergens, but you can reduce your symptoms by taking steps to limit your exposure to them. It helps to know exactly what you are allergic to so that you can avoid your specific triggers. SEEK MEDICAL CARE IF:   You have a fever.  You develop a cough that does not stop easily (persistent).  You have shortness of breath.  You start wheezing.  Symptoms interfere with normal daily activities. Document Released: 01/23/2001 Document Revised: 02/18/2013 Document Reviewed: 01/05/2013 [facility] Patient Information 2014 [place].

## 2013-10-15 ENCOUNTER — Telehealth: Payer: Self-pay | Admitting: *Deleted

## 2013-10-15 ENCOUNTER — Encounter: Payer: Self-pay | Admitting: Family Medicine

## 2013-10-15 DIAGNOSIS — J309 Allergic rhinitis, unspecified: Secondary | ICD-10-CM | POA: Insufficient documentation

## 2013-10-15 DIAGNOSIS — R21 Rash and other nonspecific skin eruption: Secondary | ICD-10-CM | POA: Insufficient documentation

## 2013-10-15 NOTE — Telephone Encounter (Signed)
Pt called back again and wanted to know what about her stomach issues. She is waiting on that answer. jw

## 2013-10-15 NOTE — Telephone Encounter (Signed)
Message copied by Johny Shears on Thu Oct 15, 2013  2:26 PM ------      Message from: Clearance Coots E      Created: Thu Oct 15, 2013  2:07 PM       Please call patient and inform her that her labs were improved. Have her follow up as scheduled. Thank you. ------

## 2013-10-15 NOTE — Telephone Encounter (Signed)
Unsure of what is causing her abdominal rash. That's why we want her to follow up in one week from yesterday. She needs to f/u with her Rheumatologist, reports over 1 years since last f/u, as rash may be with a drug reaction with her embrel. Thank you.

## 2013-10-15 NOTE — Assessment & Plan Note (Signed)
Blanching, erythematous rash on abdomen. Unsure of origin. Will continue to monitor for now.  - CBC is improved since last draw - f/u in one week if improved  - discussed with Dr. Ree Kida

## 2013-10-15 NOTE — Telephone Encounter (Signed)
LVM for patient to call back to inform her of below message.[name]

## 2013-10-15 NOTE — Progress Notes (Signed)
   Subjective:    Patient ID: [name], female    DOB: 1965-08-23, 48 y.o.   MRN: [id number]  HPI  CAMAURI FLEECE is here for sinus pressure and bruising on her abdomen.  Pt with premature ovarian failure, work up started during visit at end of April. 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  CA 125 was low level of 4.0  She has an appt schedule with Gyn on July 1.   She noticed yesterday a rash resembling a bruise on her abdomen. She denies any trauma, itching, or pain.  She's had no prior history of a similar rash. She has had normal bowel movement and voids. She denies any unilateral leg swelling. She has been having night sweats from 4-8am.  She denies any CP, SOB, or fever, She did have an episode of vomiting this morning.  [place]. She denies any bug bites. 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    She also is complaining of increase sinus pressure. She's had runny nose and pain with chewing. She was evaluated during last visit and thought to be TMJ pain. She didn't take the naprosyn or flexeril. She was prescribed some flonase by her dentist but didn't take it.    Current Outpatient Prescriptions on File Prior to Visit  Medication Sig Dispense Refill  . etanercept (ENBREL) 50 MG/ML injection Inject 50 mg into the skin once a week. On Mondays      . cyclobenzaprine (FLEXERIL) 5 MG tablet Take 1 tablet (5 mg total) by mouth at bedtime as needed for muscle spasms (For jaw pain).  30 tablet  0  . naproxen (NAPROSYN) 500 MG tablet Take 1 tablet (500 mg total) by mouth 2 (two) times daily with a meal.  30 tablet  0   No current facility-administered medications on file prior to visit.    Review of Systems See HPI    Objective:   Physical Exam BP 139/82  Pulse 83  Temp(Src) 98.3 F (36.8 C) (Oral)  Wt 163 lb (73.936 kg) Gen:  NAD, alert, cooperative with exam, well-appearing HEENT: NCAT, maxillary sinus tenderness to palpation, swollen turbinates supple neck Abd: SNTND, BS present, no guarding or organomegaly Skin: blanching, erythematous rash across abdomen  Ext: subcutaneous nodule on extensor surface the forearm near the elbow      Assessment & Plan:

## 2013-10-15 NOTE — Assessment & Plan Note (Addendum)
Reports sinus pressure today and maxillary tenderness.  - flonase 2 puffs BID for 2 weeks then descrease to one puff daily  - OTC allegra daily  - f/u in 3-4 weeks. 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 She has not tried this treatment plan. If still having pain after course of allergic rhinitis treatment plan then will try the treatment plan for TMJ (naprosyn and flexeril).

## 2013-10-15 NOTE — Telephone Encounter (Signed)
Pt called back and was read the message below. jw

## 2013-10-23 ENCOUNTER — Telehealth: Payer: Self-pay | Admitting: Family Medicine

## 2013-10-23 NOTE — Telephone Encounter (Signed)
Called patient and she was seen at Urgent care. She was in pain occuring in her jaw with a swollen face. She was prescribed amoxicillin and Vicodin for pain as well as prednisone. She will follow up as she was not able to schedule an appt today.   Rosemarie Ax, MD PGY-1, [facility] 10/23/2013, 2:06 PM

## 2013-10-23 NOTE — Telephone Encounter (Signed)
Pt called and wanted the doctor to know that her nose is still swollen and hurts very badly. The spray that was given to her at the dentist is not working and was told by Dr. Lamar Benes that is got worse or wasn't working that he would call in something else for her. Please call in something else. jw

## 2013-10-23 NOTE — Telephone Encounter (Signed)
Will forward to Dr. Raeford Razor who saw patient for last OV. Albin Duckett,CMA

## 2013-11-11 ENCOUNTER — Encounter: Payer: Self-pay | Admitting: Obstetrics & Gynecology

## 2013-11-11 ENCOUNTER — Ambulatory Visit (INDEPENDENT_AMBULATORY_CARE_PROVIDER_SITE_OTHER): Payer: 59 | Admitting: Obstetrics & Gynecology

## 2013-11-11 VITALS — BP 122/81 | HR 81 | Temp 98.1°F | Ht 65.0 in | Wt 166.1 lb

## 2013-11-11 DIAGNOSIS — N9489 Other specified conditions associated with female genital organs and menstrual cycle: Secondary | ICD-10-CM

## 2013-11-11 NOTE — Patient Instructions (Signed)
Unilateral Salpingo-Oophorectomy Unilateral salpingo-oophorectomy is the surgical removal of one fallopian tube and ovary. The ovaries are small organs that produce eggs in women. The fallopian tubes transport the egg from the ovary to the womb (uterus). A unilateral salpingo-oophorectomy may be done for various reasons, including:  Infection in the fallopian tube and ovary.  Scar tissue in the fallopian tube and ovary (adhesions).  A cyst or tumor on the ovary.  A need to remove the fallopian tube and ovary when removing the uterus.  Cancer of the fallopian tube or ovary. The removal of one fallopian tube and ovary will not prevent you from becoming pregnant, put you into menopause, or cause problems with your menstrual periods or sex drive. LET YOUR HEALTH CARE PROVIDER KNOW ABOUT:  Any allergies you have.  All medicines you are taking, including vitamins, herbs, eye drops, creams, and over-the-counter medicines.  Previous problems you or members of your family have had with the use of anesthetics.  Any blood disorders you have.  Previous surgeries you have had.  Medical conditions you have. RISKS AND COMPLICATIONS  Generally, this is a safe procedure. However, as with any procedure, complications can occur. Possible complications include:  Injury to surrounding organs.  Bleeding.  Infection.  Blood clots in the legs or lungs.  Problems related to anesthesia. BEFORE THE PROCEDURE  Ask your health care provider about changing or stopping your regular medicines. You may need to stop taking certain medicines, such as aspirin or blood thinners, at least 1 week before the surgery.  Do not eat or drink anything for at least 8 hours before the surgery.  If you smoke, do not smoke for at least 2 weeks before the surgery.  Make plans to have someone drive you home after the procedure or after your hospital stay. Also arrange for someone to help you with activities during  recovery. PROCEDURE  You will be given medicine to help you relax before the procedure (sedative). You will then be given medicine to make you sleep through the procedure (general anesthetic). These medicines will be given through an IV access tube that is put into one of your veins.  Once you are asleep, your lower abdomen will be shaved and cleaned. A thin, flexible tube (catheter) will be placed in your bladder.  The surgeon may use a laparoscopic, robotic, or open technique for this surgery:  In the laparoscopic technique, the surgery is done through two small cuts (incisions) in the abdomen. A thin, lighted tube with a tiny camera on the end (laparoscope) is inserted into one of the incisions. The tools needed for the procedure are put through the other incision.  A robotic technique may be chosen to perform complex surgery in a small space. In the robotic technique, small incisions are made. A camera and surgical instruments are passed through the incisions. Surgical instruments are controlled with the help of a robotic arm.  In the open technique, the surgery is done through one large incision in the abdomen.  Using any of these techniques, the surgeon will remove the fallopian tube and ovary. The blood vessels will be clamped and tied.  The surgeon will then use staples or stitches to close the incision or incisions. AFTER THE PROCEDURE  You will be taken to a recovery area where your progress will be monitored for 1-3 hours. Your blood pressure, pulse, and temperature will be checked often. You will remain in the recovery area until you are stable and waking up.    If the laparoscopic technique was used, you may be allowed to go home after several hours. You may have some shoulder pain. This is normal and usually goes away in a day or two.  If the open technique was used, you will be admitted to the hospital for a couple of days.  You will be given pain medicine as necessary.  The  IV tube and catheter will be removed before you are discharged. Document Released: 02/25/2009 Document Revised: 05/05/2013 Document Reviewed: 10/22/2012 ExitCare Patient Information 2015 [employer]. This information is not intended to replace advice given to you by your health care provider. Make sure you discuss any questions you have with your health care provider.  

## 2013-11-11 NOTE — Progress Notes (Addendum)
Subjective:     Patient ID: [name], female   DOB: [birth year], 48 y.o.   MRN: [id number]  HPI Pt is a 48 yo WF G3P1021 LMP 20 years with h/o cyst found on sono while evaluating for a gastroenteritis.  She had a CT, sono and CA 125 obtained.  She is asymptomatic.  She has RA and is on Embrel but, has not taken meds for 3 weeks.     Past Medical History  Diagnosis Date  . Ectopic pregnancy   . Kidney stones     "passed them"  . Rheumatoid arthritis    Past Surgical History  Procedure Laterality Date  . Ectopic pregnancy surgery  1990's  . Hip surgery Bilateral 2000's    "had fluid in them"  . Salpingoophorectomy  1990's    "related to ectopic pregnancy"   Current Outpatient Prescriptions on File Prior to Visit  Medication Sig Dispense Refill  . etanercept (ENBREL) 50 MG/ML injection Inject 50 mg into the skin once a week. On Mondays       No current facility-administered medications on file prior to visit.   History   Social History  . Marital Status: Married    Spouse Name: N/A    Number of Children: N/A  . Years of Education: N/A   Occupational History  . Not on file.   Social History Main Topics  . Smoking status: Never Smoker   . Smokeless tobacco: Never Used  . Alcohol Use: No  . Drug Use: Yes    Special: Marijuana     Comment: 1 jt per day at night  . Sexual Activity: Not Currently   Other Topics Concern  . Not on file   Social History Narrative  . No narrative on file   Family History  Problem Relation Age of Onset  . Arthritis Mother   . Hypertension Mother   . Cancer Neg Hx         Review of Systems     Objective:   Physical Exam BP 122/81  Pulse 81  Temp(Src) 98.1 F (36.7 C) (Oral)  Ht 5\' 5"  (1.651 m)  Wt 166 lb 1.6 oz (75.342 kg)  BMI 27.64 kg/m2 Pt in NAD Lungs: CTA CV: RRR Abd: soft, NT, ND. Rash on abd (pt thinks its a reaction to something- her primary care physician has evaluated it prev) GU: EGBUS: no  lesions Vagina: no blood in vault Cervix: no CMT Uterus: small, mobile Adnexa: no masses; non tender; no masses palpated Ext: nodules on joints     07/09/2013     CLINICAL DATA: Upper abdominal pain, nausea, vomiting and diarrhea  with decreased oral intake for the past 5 days  EXAM:  CT ABDOMEN AND PELVIS WITH CONTRAST  TECHNIQUE:  Multidetector CT imaging of the abdomen and pelvis was performed  using the standard protocol following bolus administration of  intravenous contrast.  CONTRAST: 12mL OMNIPAQUE IOHEXOL 300 MG/ML SOLN  COMPARISON: US ABDOMEN COMPLETE dated 07/13/2008  FINDINGS:  Normal hepatic contour. There is diffuse decreased attenuation of  the hepatic parenchyma suggestive of hepatic steatosis. There is a  minimal amount of focal fatty sparing adjacent to the fissure for  ligamentum teres as well as the gallbladder fossa. No discrete  hepatic lesions. Normal appearance of the gallbladder. No definite  radiopaque gallstones. No intra extrahepatic dilatation. No ascites.  There is symmetric enhancement and excretion of the bilateral  kidneys. Subcentimeter right-sided hypoattenuating renal lesions are  too small  to accurately characterize though favored to represent  renal cysts. No definite renal stones on this postcontrast  examination. No urinary obstruction or perinephric stranding. Normal  appearance of the bilateral adrenal glands and pancreas. The spleen  is enlarged measuring 15.9 cm in length. No discrete splenic  lesions. No perisplenic stranding.  Scatter minimal colonic diverticulosis without evidence of  diverticulitis. The bowel is otherwise normal in course and caliber  without wall thickening or evidence of obstruction. Normal  appearance of the appendix. No pneumoperitoneum, pneumatosis or  portal venous gas.  Scattered mixed calcified and noncalcified atherosclerotic plaque  with a normal caliber abdominal aorta. The major branch vessels of   the abdominal aorta are patent on this non CTA examination.  Incidental note is made of a circumaortic left renal vein. The  portal vein is widely patent.  Shotty porta hepatis lymph nodes with index porta hepatis nodal  conglomeration measuring approximately 1.4 cm in greatest short axis  diameter (image 30, series 2) presumably reactive in etiology.  Otherwise, no retroperitoneal, mesenteric, pelvic or inguinal  lymphadenopathy.  Note is made of a large, approximately 7.3 x 5.5 cm left-sided  adnexal cyst (coronal image 76, series 4). Additionally, there is an  adjacent smaller approximately 2.5 x 1.5 cm left-sided adnexal cyst  (coronal image 85, series 4). No discrete right-sided adnexal  lesions. There is a trace amount of presumed physiologic fluid  within the endometrial canal. No free fluid within the pelvic  cul-de-sac.  Limited visualization of the lower thorax demonstrates minimal  subsegmental atelectasis within in the image caudal aspect of the  lingula. No focal airspace opacities. No pleural effusion.  Normal heart size. No pericardial effusion.  No acute or aggressive osseous abnormalities.  IMPRESSION:  1. No explanation for patient's abdominal pain. Specifically, no  evidence of enteric or urinary obstruction. Normal appearance of the  appendix.  2. Hepatic steatosis. Correlation with LFTs is recommended.  3. Splenomegaly, the etiology of which is not depicted on this  examination.  4. Note is made of two adjacent left-sided presumably physiologic  ovarian cysts, the largest of which measures approximately 7.3 cm in  diameter. Given the size of the dominant cyst, further evaluation  with dedicated pelvic ultrasound in 6 weeks is recommended to ensure  stability and/or resolution      09/2013 CLINICAL DATA: Premature ovarian failure. Cystic adnexal mass seen  on recent CT.  EXAM:  TRANSABDOMINAL AND TRANSVAGINAL ULTRASOUND OF PELVIS  TECHNIQUE:  Both  transabdominal and transvaginal ultrasound examinations of the  pelvis were performed. Transabdominal technique was performed for  global imaging of the pelvis including uterus, ovaries, adnexal  regions, and pelvic cul-de-sac. It was necessary to proceed with  endovaginal exam following the transabdominal exam to visualize the  ovaries and adnexal mass.  COMPARISON: CT on 07/09/2013  FINDINGS:  Uterus  Measurements: 7.0 x 3.2 x 3.8 cm. No fibroids or other mass  visualized.  Endometrium  Thickness: 6 mm. No focal abnormality visualized.  Right ovary  Measurements: Not directly visualized by transabdominal or  transvaginal sonography, however no adnexal mass identified.  Left ovary  Measurements: No normal ovary visualized. A complex cystic mass is  seen containing multiple septations, a few of which are thickened  measuring up to 5 mm and show internal blood flow on color Doppler  ultrasound. This measures 7.7 by 6.2 x 7.6 cm. This is highly  suspicious for cystic ovarian neoplasm, and malignancy cannot be  excluded.  Other  findings  No free fluid.  IMPRESSION:  7.7 cm complex cystic adnexal mass which is highly suspicious for  cystic ovarian neoplasm. Some thickened septations are visualized,  which are worrisome for possible malignancy. Surgical evaluation is  recommended. This recommendation follows the consensus statement:  Management of Asymptomatic Ovarian and Other Adnexal Cysts Imaged at  [place]: Society of Radiologists in [place]. Radiology 2010; [phone].  No evidence of ascites. No other pelvic mass visualized.  Normal appearance of uterus.   CBC    Component Value Date/Time   WBC 4.8 10/14/2013 1526   RBC 4.44 10/14/2013 1526   HGB 14.2 10/14/2013 1526   HCT 39.9 10/14/2013 1526   PLT 136* 10/14/2013 1526   MCV 89.9 10/14/2013 1526   MCH 32.0 10/14/2013 1526   MCHC 35.6 10/14/2013 1526   RDW 13.4 10/14/2013 1526   LYMPHSABS 2.2 09/08/2013  1414   MONOABS 0.5 09/08/2013 1414   EOSABS 0.1 09/08/2013 1414   BASOSABS 0.0 09/08/2013 1414     Assessment:     Complex ovarian cyst d/w pt treatment options vs observation.  rec removal. Pt and her mother desire to have the cyst adn ovary removed.      Plan:     Patient desires surgical management with left salping oophorectomy with right salpingectomy if still in situ.  The risks of surgery were discussed in detail with the patient including but not limited to: bleeding which may require transfusion or reoperation; infection which may require prolonged hospitalization or re-hospitalization and antibiotic therapy; injury to bowel, bladder, ureters and major vessels or other surrounding organs; need for additional procedures including laparotomy; thromboembolic phenomenon, incisional problems and other postoperative or anesthesia complications.  Patient was told that the likelihood that her condition and symptoms will be treated effectively with this surgical management was very high; the postoperative expectations were also discussed in detail. The patient also understands the alternative treatment options which were discussed in full. All questions were answered.  She was told that she will be contacted by our surgical scheduler regarding the time and date of her surgery; routine preoperative instructions of having nothing to eat or drink after midnight on the day prior to surgery and also coming to the hospital 1 1/2 hours prior to her time of surgery were also emphasized.  She was told she may be called for a preoperative appointment about a week prior to surgery and will be given further preoperative instructions at that visit. Printed patient education handouts about the procedure were given to the patient to review at home.

## 2013-11-16 ENCOUNTER — Ambulatory Visit (HOSPITAL_COMMUNITY)
Admission: RE | Admit: 2013-11-16 | Discharge: 2013-11-16 | Disposition: A | Discharge status: Home / Self Care | Payer: 59 | Source: Ambulatory Visit | Attending: Obstetrics & Gynecology | Admitting: Obstetrics & Gynecology

## 2013-11-16 DIAGNOSIS — N839 Noninflammatory disorder of ovary, fallopian tube and broad ligament, unspecified: Secondary | ICD-10-CM | POA: Insufficient documentation

## 2013-11-16 DIAGNOSIS — N9489 Other specified conditions associated with female genital organs and menstrual cycle: Secondary | ICD-10-CM | POA: Insufficient documentation

## 2013-12-16 ENCOUNTER — Encounter (HOSPITAL_COMMUNITY): Payer: Self-pay | Admitting: *Deleted

## 2013-12-18 ENCOUNTER — Encounter (HOSPITAL_COMMUNITY): Payer: Self-pay | Admitting: Pharmacist

## 2013-12-30 ENCOUNTER — Ambulatory Visit (HOSPITAL_COMMUNITY): Payer: 59 | Admitting: Anesthesiology

## 2013-12-30 ENCOUNTER — Ambulatory Visit (HOSPITAL_COMMUNITY)
Admission: RE | Admit: 2013-12-30 | Discharge: 2013-12-30 | Disposition: A | Discharge status: Home / Self Care | Payer: 59 | Source: Ambulatory Visit | Attending: Obstetrics & Gynecology | Admitting: Obstetrics & Gynecology

## 2013-12-30 ENCOUNTER — Encounter (HOSPITAL_COMMUNITY): Payer: Self-pay | Admitting: Anesthesiology

## 2013-12-30 ENCOUNTER — Encounter (HOSPITAL_COMMUNITY)
Admission: RE | Disposition: A | Discharge status: Home / Self Care | Payer: Self-pay | Source: Ambulatory Visit | Attending: Obstetrics & Gynecology

## 2013-12-30 ENCOUNTER — Encounter (HOSPITAL_COMMUNITY): Payer: 59 | Admitting: Anesthesiology

## 2013-12-30 DIAGNOSIS — R161 Splenomegaly, not elsewhere classified: Secondary | ICD-10-CM | POA: Insufficient documentation

## 2013-12-30 DIAGNOSIS — K7689 Other specified diseases of liver: Secondary | ICD-10-CM | POA: Insufficient documentation

## 2013-12-30 DIAGNOSIS — N838 Other noninflammatory disorders of ovary, fallopian tube and broad ligament: Secondary | ICD-10-CM

## 2013-12-30 DIAGNOSIS — N83209 Unspecified ovarian cyst, unspecified side: Secondary | ICD-10-CM

## 2013-12-30 HISTORY — PX: LAPAROSCOPIC SALPINGO OOPHERECTOMY: SHX5927

## 2013-12-30 LAB — TYPE AND SCREEN
ABO/RH(D): B NEG
ANTIBODY SCREEN: NEGATIVE

## 2013-12-30 LAB — CBC
HCT: 40.8 % (ref 36.0–46.0)
Hemoglobin: 15.1 g/dL — ABNORMAL HIGH (ref 12.0–15.0)
MCH: 33.2 pg (ref 26.0–34.0)
MCHC: 37 g/dL — AB (ref 30.0–36.0)
MCV: 89.7 fL (ref 78.0–100.0)
Platelets: 107 10*3/uL — ABNORMAL LOW (ref 150–400)
RBC: 4.55 MIL/uL (ref 3.87–5.11)
RDW: 12.7 % (ref 11.5–15.5)
WBC: 4 10*3/uL (ref 4.0–10.5)

## 2013-12-30 LAB — PREGNANCY, URINE: Preg Test, Ur: NEGATIVE

## 2013-12-30 LAB — ABO/RH: ABO/RH(D): B NEG

## 2013-12-30 SURGERY — SALPINGO-OOPHORECTOMY, LAPAROSCOPIC
Anesthesia: General | Site: Abdomen | Laterality: Left

## 2013-12-30 MED ORDER — NEOSTIGMINE METHYLSULFATE 10 MG/10ML IV SOLN
INTRAVENOUS | Status: AC
  Filled 2013-12-30: qty 1

## 2013-12-30 MED ORDER — FENTANYL CITRATE 0.05 MG/ML IJ SOLN
INTRAMUSCULAR | Status: AC
  Filled 2013-12-30: qty 5

## 2013-12-30 MED ORDER — HYDROMORPHONE HCL PF 1 MG/ML IJ SOLN
INTRAMUSCULAR | Status: DC | PRN
  Administered 2013-12-30: 1 mg via INTRAVENOUS

## 2013-12-30 MED ORDER — KETOROLAC TROMETHAMINE 30 MG/ML IJ SOLN
INTRAMUSCULAR | Status: DC | PRN
  Administered 2013-12-30: 30 mg via INTRAVENOUS

## 2013-12-30 MED ORDER — BUPIVACAINE HCL (PF) 0.25 % IJ SOLN
INTRAMUSCULAR | Status: AC
  Filled 2013-12-30: qty 30

## 2013-12-30 MED ORDER — METOCLOPRAMIDE HCL 5 MG/ML IJ SOLN
INTRAMUSCULAR | Status: AC
  Administered 2013-12-30: 10 mg via INTRAVENOUS
  Filled 2013-12-30: qty 2

## 2013-12-30 MED ORDER — OXYCODONE-ACETAMINOPHEN 5-325 MG PO TABS: 1.0000 | ORAL_TABLET | Freq: Four times a day (QID) | ORAL | Status: DC | PRN

## 2013-12-30 MED ORDER — IBUPROFEN 600 MG PO TABS: 600.0000 mg | ORAL_TABLET | Freq: Four times a day (QID) | ORAL | Status: DC | PRN

## 2013-12-30 MED ORDER — ROCURONIUM BROMIDE 100 MG/10ML IV SOLN
INTRAVENOUS | Status: DC | PRN
  Administered 2013-12-30: 30 mg via INTRAVENOUS

## 2013-12-30 MED ORDER — PROMETHAZINE HCL 25 MG/ML IJ SOLN
INTRAMUSCULAR | Status: AC
  Administered 2013-12-30: 12.5 mg via INTRAVENOUS
  Filled 2013-12-30: qty 1

## 2013-12-30 MED ORDER — SCOPOLAMINE 1 MG/3DAYS TD PT72
1.0000 | MEDICATED_PATCH | Freq: Once | TRANSDERMAL | Status: DC
  Administered 2013-12-30: 1.5 mg via TRANSDERMAL

## 2013-12-30 MED ORDER — MIDAZOLAM HCL 2 MG/2ML IJ SOLN
INTRAMUSCULAR | Status: DC | PRN
  Administered 2013-12-30: 2 mg via INTRAVENOUS

## 2013-12-30 MED ORDER — FENTANYL CITRATE 0.05 MG/ML IJ SOLN
INTRAMUSCULAR | Status: DC | PRN
  Administered 2013-12-30 (×2): 50 ug via INTRAVENOUS
  Administered 2013-12-30: 100 ug via INTRAVENOUS
  Administered 2013-12-30: 50 ug via INTRAVENOUS

## 2013-12-30 MED ORDER — MIDAZOLAM HCL 2 MG/2ML IJ SOLN
INTRAMUSCULAR | Status: AC
  Filled 2013-12-30: qty 2

## 2013-12-30 MED ORDER — NEOSTIGMINE METHYLSULFATE 10 MG/10ML IV SOLN
INTRAVENOUS | Status: DC | PRN
  Administered 2013-12-30: 2 mg via INTRAVENOUS

## 2013-12-30 MED ORDER — DIPHENHYDRAMINE HCL 50 MG/ML IJ SOLN
INTRAMUSCULAR | Status: AC
  Filled 2013-12-30: qty 1

## 2013-12-30 MED ORDER — LACTATED RINGERS IR SOLN
Status: DC | PRN
  Administered 2013-12-30: 3000 mL

## 2013-12-30 MED ORDER — DEXAMETHASONE SODIUM PHOSPHATE 10 MG/ML IJ SOLN
INTRAMUSCULAR | Status: AC
  Filled 2013-12-30: qty 1

## 2013-12-30 MED ORDER — KETOROLAC TROMETHAMINE 30 MG/ML IJ SOLN
INTRAMUSCULAR | Status: AC
  Filled 2013-12-30: qty 1

## 2013-12-30 MED ORDER — PROMETHAZINE HCL 25 MG/ML IJ SOLN
6.2500 mg | INTRAMUSCULAR | Status: DC | PRN
  Administered 2013-12-30: 12.5 mg via INTRAVENOUS

## 2013-12-30 MED ORDER — ROCURONIUM BROMIDE 100 MG/10ML IV SOLN
INTRAVENOUS | Status: AC
  Filled 2013-12-30: qty 1

## 2013-12-30 MED ORDER — MEPERIDINE HCL 25 MG/ML IJ SOLN: 6.2500 mg | INTRAMUSCULAR | Status: DC | PRN

## 2013-12-30 MED ORDER — LIDOCAINE HCL (CARDIAC) 20 MG/ML IV SOLN
INTRAVENOUS | Status: DC | PRN
  Administered 2013-12-30: 80 mg via INTRAVENOUS

## 2013-12-30 MED ORDER — ONDANSETRON HCL 4 MG/2ML IJ SOLN
INTRAMUSCULAR | Status: DC | PRN
  Administered 2013-12-30: 4 mg via INTRAVENOUS

## 2013-12-30 MED ORDER — SODIUM CHLORIDE 0.9 % IJ SOLN
INTRAMUSCULAR | Status: AC
  Filled 2013-12-30: qty 10

## 2013-12-30 MED ORDER — FENTANYL CITRATE 0.05 MG/ML IJ SOLN: 25.0000 ug | INTRAMUSCULAR | Status: DC | PRN

## 2013-12-30 MED ORDER — GLYCOPYRROLATE 0.2 MG/ML IJ SOLN
INTRAMUSCULAR | Status: AC
  Filled 2013-12-30: qty 3

## 2013-12-30 MED ORDER — PROPOFOL 10 MG/ML IV EMUL
INTRAVENOUS | Status: AC
  Filled 2013-12-30: qty 20

## 2013-12-30 MED ORDER — SCOPOLAMINE 1 MG/3DAYS TD PT72
MEDICATED_PATCH | TRANSDERMAL | Status: AC
  Filled 2013-12-30: qty 1

## 2013-12-30 MED ORDER — GLYCOPYRROLATE 0.2 MG/ML IJ SOLN
INTRAMUSCULAR | Status: DC | PRN
  Administered 2013-12-30: 0.4 mg via INTRAVENOUS

## 2013-12-30 MED ORDER — ONDANSETRON HCL 4 MG/2ML IJ SOLN
INTRAMUSCULAR | Status: AC
  Filled 2013-12-30: qty 2

## 2013-12-30 MED ORDER — LACTATED RINGERS IV SOLN
INTRAVENOUS | Status: DC
  Administered 2013-12-30 (×2): via INTRAVENOUS

## 2013-12-30 MED ORDER — BUPIVACAINE HCL (PF) 0.25 % IJ SOLN
INTRAMUSCULAR | Status: DC | PRN
  Administered 2013-12-30: 10 mL

## 2013-12-30 MED ORDER — LACTATED RINGERS IV SOLN: INTRAVENOUS | Status: DC

## 2013-12-30 MED ORDER — DEXAMETHASONE SODIUM PHOSPHATE 10 MG/ML IJ SOLN
INTRAMUSCULAR | Status: DC | PRN
  Administered 2013-12-30: 4 mg via INTRAVENOUS

## 2013-12-30 MED ORDER — PROPOFOL 10 MG/ML IV BOLUS
INTRAVENOUS | Status: DC | PRN
  Administered 2013-12-30: 150 mg via INTRAVENOUS
  Administered 2013-12-30: 50 mg via INTRAVENOUS

## 2013-12-30 MED ORDER — METOCLOPRAMIDE HCL 5 MG/ML IJ SOLN
10.0000 mg | Freq: Once | INTRAMUSCULAR | Status: AC | PRN
  Administered 2013-12-30: 10 mg via INTRAVENOUS

## 2013-12-30 MED ORDER — HYDROMORPHONE HCL PF 1 MG/ML IJ SOLN
INTRAMUSCULAR | Status: AC
  Filled 2013-12-30: qty 1

## 2013-12-30 MED ORDER — LIDOCAINE HCL (CARDIAC) 20 MG/ML IV SOLN
INTRAVENOUS | Status: AC
  Filled 2013-12-30: qty 5

## 2013-12-30 MED ORDER — DIPHENHYDRAMINE HCL 50 MG/ML IJ SOLN
12.5000 mg | Freq: Once | INTRAMUSCULAR | Status: AC
  Administered 2013-12-30: 12.5 mg via INTRAVENOUS

## 2013-12-30 SURGICAL SUPPLY — 37 items
APPLICATOR SURGIFLO (MISCELLANEOUS) ×2 IMPLANT
BENZOIN TINCTURE PRP APPL 2/3 (GAUZE/BANDAGES/DRESSINGS) ×2 IMPLANT
BLADE SURG 11 STRL SS (BLADE) ×2 IMPLANT
CABLE HIGH FREQUENCY MONO STRZ (ELECTRODE) IMPLANT
CATH ROBINSON RED A/P 16FR (CATHETERS) ×2 IMPLANT
CHLORAPREP W/TINT 26ML (MISCELLANEOUS) ×2 IMPLANT
CLOTH BEACON ORANGE TIMEOUT ST (SAFETY) ×2 IMPLANT
DISSECTOR BLUNT TIP ENDO 5MM (MISCELLANEOUS) ×2 IMPLANT
DRESSING OPSITE X SMALL 2X3 (GAUZE/BANDAGES/DRESSINGS) ×2 IMPLANT
DRSG COVADERM PLUS 2X2 (GAUZE/BANDAGES/DRESSINGS) ×2 IMPLANT
DRSG TELFA 3X8 NADH (GAUZE/BANDAGES/DRESSINGS) ×2 IMPLANT
GLOVE BIO SURGEON STRL SZ7 (GLOVE) ×4 IMPLANT
GLOVE BIOGEL PI IND STRL 7.0 (GLOVE) ×1 IMPLANT
GLOVE BIOGEL PI INDICATOR 7.0 (GLOVE) ×1
GOWN STRL REUS W/TWL LRG LVL3 (GOWN DISPOSABLE) ×4 IMPLANT
MANIPULATOR UTERINE 4.5 ZUMI (MISCELLANEOUS) IMPLANT
NEEDLE INSUFFLATION 120MM (ENDOMECHANICALS) ×2 IMPLANT
NS IRRIG 1000ML POUR BTL (IV SOLUTION) ×2 IMPLANT
PACK LAPAROSCOPY BASIN (CUSTOM PROCEDURE TRAY) ×2 IMPLANT
POUCH SPECIMEN RETRIEVAL 10MM (ENDOMECHANICALS) IMPLANT
PROTECTOR NERVE ULNAR (MISCELLANEOUS) ×4 IMPLANT
SET IRRIG TUBING LAPAROSCOPIC (IRRIGATION / IRRIGATOR) ×2 IMPLANT
SHEARS HARMONIC ACE PLUS 36CM (ENDOMECHANICALS) ×2 IMPLANT
SPONGE GAUZE 4X4 12PLY STER LF (GAUZE/BANDAGES/DRESSINGS) ×2 IMPLANT
STRIP CLOSURE SKIN 1/2X4 (GAUZE/BANDAGES/DRESSINGS) ×2 IMPLANT
SURGIFLO W/THROMBIN 8M KIT (HEMOSTASIS) ×2 IMPLANT
SUT VICRYL 0 UR6 27IN ABS (SUTURE) ×4 IMPLANT
SUT VICRYL 4-0 PS2 18IN ABS (SUTURE) ×4 IMPLANT
SYR 5ML LL (SYRINGE) ×2 IMPLANT
TAPE CLOTH SURG 4X10 WHT LF (GAUZE/BANDAGES/DRESSINGS) ×2 IMPLANT
TOWEL OR 17X24 6PK STRL BLUE (TOWEL DISPOSABLE) ×4 IMPLANT
TRAY FOLEY CATH 14FR (SET/KITS/TRAYS/PACK) ×2 IMPLANT
TROCAR BALLN 12MMX100 BLUNT (TROCAR) IMPLANT
TROCAR OPTI TIP 5M 100M (ENDOMECHANICALS) ×2 IMPLANT
TROCAR XCEL DIL TIP R 11M (ENDOMECHANICALS) ×2 IMPLANT
TROCAR XCEL OPT SLVE 5M 100M (ENDOMECHANICALS) ×4 IMPLANT
WARMER LAPAROSCOPE (MISCELLANEOUS) ×2 IMPLANT

## 2013-12-30 NOTE — Op Note (Signed)
12/30/2013  3:28 PM  PATIENT:  [name]  48 y.o. female  PRE-OPERATIVE DIAGNOSIS:  COMPLEX OVARIAN CYST  POST-OPERATIVE DIAGNOSIS:  COMPLEX OVARIAN CYST  PROCEDURE: Left salping oophorectomy  SURGEON:  Surgeon(s) and Role:    * Lavonia Drafts, MD - Primary    * Mora Bellman, MD - Assisting  ANESTHESIA:   general  EBL:  Total I/O In: 1500 [I.V.:1500] Out: 300 [Urine:100; Blood:200]  BLOOD ADMINISTERED:none  DRAINS: none   LOCAL MEDICATIONS USED:  MARCAINE     SPECIMEN:  left fallopian tube and ovary   DISPOSITION OF SPECIMEN:  PATHOLOGY  COUNTS:  YES  TOURNIQUET:  * No tourniquets in log *  DICTATION: .Note written in EPIC  PLAN OF CARE: Discharge to home after PACU  PATIENT DISPOSITION:  PACU - hemodynamically stable.  COMPLICATIONS: None immediate   Delay start of Pharmacological VTE agent (>24hrs) due to surgical blood loss or risk of bleeding: not applicable  INDICATIONS: 48 y.o. [id number] at Unknown here with the preoperative diagnoses as listed above.  Please refer to preoperative notes for more details. Patient was counseled regarding need for laparoscopic salpingectomy. Risks of surgery including bleeding which may require transfusion or reoperation, infection, injury to bowel or other surrounding organs, need for additional procedures including laparotomy and other postoperative/anesthesia complications were explained to patient.  Written informed consent was obtained.  FINDINGS: large left ovary with clear fluid.  Left fallopian tube adherent to left ovary. Right fallopian tube and ovary surgically absent.  Extensive adhesion of bowel to adnexa.  Omentum adherent to uterus.   PROCEDURE IN DETAIL:  The patient was taken to the operating room where general anesthesia was administered and was found to be adequate.  She was placed in the dorsal lithotomy position, and was prepped and draped in a sterile manner.  A Foley catheter was inserted  into her bladder and attached to constant drainage and a uterine manipulator was then advanced into the uterus .  After an adequate timeout was performed, attention was then turned to the patient's abdomen where a 5-mm skin incision was made on the umbilical fold.  The Veress needle was carefully introduced into the peritoneal cavity at a 45-degree angle into the abdominal wall.  Intraperitoneal placement was confirmed by drop in intraabdominal pressure with insufflation of carbon dioxide gas.  Adequate pneumoperitoneum was obtained, and the 5-mm trocar and sleeve were then advanced without difficulty into the abdomen in the left upper quadrant where intraabdominal placement was confirmed by the laparoscope. A survey of the patient's pelvis and abdomen revealed the findings as above.  Two 5-mm ports were placed on the patients right side under direct visualization.  The suction irrigator was then used to suction the hemoperitoneum and irrigate the pelvis.  Attention was then turned to the left fallopian tube and ovary which was dissected out from the adhesions using blunt and sharp dissection. Once the cyst wall was dissected out the underlying mesosalpinx and uterine attachment were ligated using the Harmonic scalpel.  Good hemostasis was noted.  The specimen was placed in an EndoCatch bag and removed from the abdomen intact.  The abdomen was desufflated, and all instruments were removed.  The 53mm incision was closed with 0 vicryl and the skin incisions were closed with 3-0 vicryl and covered with benzoin and steri-strips   The patient tolerated the procedure well.  All instruments, needles, and sponge counts were correct x 2. The patient was taken to the recovery room in  stable condition.   The patient will be discharged to home as per PACU criteria.  Routine postoperative instructions given.  She was prescribed Percocet and Ibuprofen.   She will follow up in the clinic in about 2 weeks for postoperative  evaluation.

## 2013-12-30 NOTE — Discharge Instructions (Signed)
Unilateral Salpingo-Oophorectomy, Care After Refer to this sheet in the next few weeks. These instructions provide you with information on caring for yourself after your procedure. Your health care provider may also give you more specific instructions. Your treatment has been planned according to current medical practices, but problems sometimes occur. Call your health care provider if you have any problems or questions after your procedure. WHAT TO EXPECT AFTER THE PROCEDURE After your procedure, it is typical to have the following:  Abdominal pain that can be controlled with pain medicine.  Vaginal spotting.  Constipation. HOME CARE INSTRUCTIONS   Get plenty of rest and sleep.  Only take over-the-counter or prescription medicines as directed by your health care provider. Do not take aspirin. It can cause bleeding.  Keep incision areas clean and dry. Remove or change any bandages (dressings) only as directed by your health care provider.  Follow your health care provider's advice regarding diet.  Drink enough fluids to keep your urine clear or pale yellow.  Limit exercise and activities as directed by your health care provider. Do not lift anything heavier than 5 pounds (2.3 kg) until your health care provider approves.  Do not drive until your health care provider approves.  Do not drink alcohol until your health care provider approves.  Do not have sexual intercourse until your health care provider says it is OK.  Take your temperature twice a day and write it down.  If you become constipated, you may:  Ask your health care provider about taking a mild laxative.  Add more fruit and bran to your diet.  Drink more fluids.  Follow up with your health care provider as directed. SEEK MEDICAL CARE IF:   You have swelling or redness in the incision area.  You develop a rash.  You feel lightheaded.  You have pain that is not controlled with medicine.  You have pain,  swelling, or redness where the IV access tube was placed. SEEK IMMEDIATE MEDICAL CARE IF:  You have a fever.  You develop increasing abdominal pain.  You see pus coming out of the incision, or the incision is separating.  You notice a bad smell coming from the wound or dressing.  You have excessive vaginal bleeding.  You feel sick to your stomach (nauseous) and vomit.  You have leg or chest pain.  You have pain when you urinate.  You develop shortness of breath.  You pass out. Document Released: 02/24/2009 Document Revised: 02/18/2013 Document Reviewed: 10/22/2012 ExitCare Patient Information 2015 [employer]. This information is not intended to replace advice given to you by your health care provider. Make sure you discuss any questions you have with your health care provider.  

## 2013-12-30 NOTE — Transfer of Care (Signed)
Immediate Anesthesia Transfer of Care Note  Patient: [name]  Procedure(s) Performed: Procedure(s) with comments: LEFT SALPINGOOPHORECTOMY (Left) - BILATERAL SALPINGECTOMY  Patient Location: PACU  Anesthesia Type:General  Level of Consciousness: awake, alert  and oriented  Airway & Oxygen Therapy: Patient Spontanous Breathing and Patient connected to nasal cannula oxygen  Post-op Assessment: Report given to PACU RN and Post -op Vital signs reviewed and stable  Post vital signs: Reviewed and stable  Complications: No apparent anesthesia complications

## 2013-12-30 NOTE — Brief Op Note (Signed)
12/30/2013  3:28 PM  PATIENT:  [name]  48 y.o. female  PRE-OPERATIVE DIAGNOSIS:  COMPLEX OVARIAN CYST  POST-OPERATIVE DIAGNOSIS:  COMPLEX OVARIAN CYST  PROCEDURE: Left salping oophorectomy  SURGEON:  Surgeon(s) and Role:    * Lavonia Drafts, MD - Primary    * Mora Bellman, MD - Assisting  ANESTHESIA:   general  EBL:  Total I/O In: 1500 [I.V.:1500] Out: 300 [Urine:100; Blood:200]  BLOOD ADMINISTERED:none  DRAINS: none   LOCAL MEDICATIONS USED:  MARCAINE     SPECIMEN:  left fallopian tube and ovary   DISPOSITION OF SPECIMEN:  PATHOLOGY  COUNTS:  YES  TOURNIQUET:  * No tourniquets in log *  DICTATION: .Note written in EPIC  PLAN OF CARE: Discharge to home after PACU  PATIENT DISPOSITION:  PACU - hemodynamically stable.   Delay start of Pharmacological VTE agent (>24hrs) due to surgical blood loss or risk of bleeding: not applicable

## 2013-12-30 NOTE — Anesthesia Postprocedure Evaluation (Signed)
  Anesthesia Post-op Note  Patient: [name]  Procedure(s) Performed: Procedure(s) with comments: LEFT SALPINGOOPHORECTOMY (Left) - BILATERAL SALPINGECTOMY  Patient Location: PACU  Anesthesia Type:General  Level of Consciousness: awake, alert  and oriented  Airway and Oxygen Therapy: Patient Spontanous Breathing  Post-op Pain: mild  Post-op Assessment: Post-op Vital signs reviewed, Patient's Cardiovascular Status Stable, Respiratory Function Stable, Patent Airway, NAUSEA AND VOMITING PRESENT and Pain level controlled  Post-op Vital Signs: Reviewed and stable  Last Vitals:  Filed Vitals:   12/30/13 1630  BP: 94/61  Pulse: 78  Temp:   Resp: 10    Complications: No apparent anesthesia complications

## 2013-12-30 NOTE — Anesthesia Preprocedure Evaluation (Addendum)
Anesthesia Evaluation  Patient identified by MRN, date of birth, ID band Patient awake    Reviewed: Allergy & Precautions, H&P , NPO status , Patient's Chart, lab work & pertinent test results, reviewed documented beta blocker date and time   Airway Mallampati: I TM Distance: >3 FB Neck ROM: Full    Dental no notable dental hx. (+) Teeth Intact   Pulmonary neg pulmonary ROS,  breath sounds clear to auscultation  Pulmonary exam normal       Cardiovascular negative cardio ROS  Rhythm:Regular Rate:Normal     Neuro/Psych negative neurological ROS  negative psych ROS   GI/Hepatic Neg liver ROS,   Endo/Other  negative endocrine ROS  Renal/GU Renal diseaseRenal calculi  negative genitourinary   Musculoskeletal  (+) Arthritis -, Rheumatoid disorders,    Abdominal   Peds  Hematology negative hematology ROS (+) Hx/o Thrombocytopenia   Anesthesia Other Findings Thrombocytopenia.... Now 107 Good neck ROM  Reproductive/Obstetrics Complex left ovarian cyst                           Anesthesia Physical Anesthesia Plan  ASA: II  Anesthesia Plan: General   Post-op Pain Management:    Induction: Intravenous  Airway Management Planned: Oral ETT  Additional Equipment:   Intra-op Plan:   Post-operative Plan: Extubation in OR  Informed Consent: I have reviewed the patients History and Physical, chart, labs and discussed the procedure including the risks, benefits and alternatives for the proposed anesthesia with the patient or authorized representative who has indicated his/her understanding and acceptance.   Dental advisory given  Plan Discussed with: CRNA, Anesthesiologist and Surgeon  Anesthesia Plan Comments:         Anesthesia Quick Evaluation

## 2013-12-30 NOTE — H&P (Signed)
Patient ID: [name], female DOB: 1966/02/25, 48 y.o. MRN: [id number]  HPI Pt is a 48 yo WF G3P1021 LMP 20 years with h/o cyst found on sono while evaluating for a gastroenteritis. She had a CT, sono and CA 125 obtained. She is asymptomatic. She has RA and is on Embrel but, has not taken meds for 3 weeks.  Past Medical History   Diagnosis  Date   .  Ectopic pregnancy    .  Kidney stones      "passed them"   .  Rheumatoid arthritis     Past Surgical History   Procedure  Laterality  Date   .  Ectopic pregnancy surgery   1990's   .  Hip surgery  Bilateral  2000's     "had fluid in them"   .  Salpingoophorectomy   1990's     "related to ectopic pregnancy"    Current Outpatient Prescriptions on File Prior to Visit   Medication  Sig  Dispense  Refill   .  etanercept (ENBREL) 50 MG/ML injection  Inject 50 mg into the skin once a week. On Mondays      No current facility-administered medications on file prior to visit.    History    Social History   .  Marital Status:  Married     Spouse Name:  N/A     Number of Children:  N/A   .  Years of Education:  N/A    Occupational History   .  Not on file.    Social History Main Topics   .  Smoking status:  Never Smoker   .  Smokeless tobacco:  Never Used   .  Alcohol Use:  No   .  Drug Use:  Yes     Special:  Marijuana      Comment: 1 jt per day at night   .  Sexual Activity:  Not Currently    Other Topics  Concern   .  Not on file    Social History Narrative   .  No narrative on file    Family History   Problem  Relation  Age of Onset   .  Arthritis  Mother    .  Hypertension  Mother    .  Cancer  Neg Hx    Review of Systems  Objective:   Physical Exam BP 122/81  Pulse 81  Temp(Src) 98.1 F (36.7 C) (Oral)  Ht 5\' 5"  (1.651 m)  Wt 166 lb 1.6 oz (75.342 kg)  BMI 27.64 kg/m2  Pt in NAD  Lungs: CTA  CV: RRR  Abd: soft, NT, ND. Rash on abd (pt thinks its a reaction to something- her primary care physician has  evaluated it prev)  GU:  EGBUS: no lesions  Vagina: no blood in vault  Cervix: no CMT  Uterus: small, mobile  Adnexa: no masses; non tender; no masses palpated  Ext: nodules on joints  07/09/2013      CLINICAL DATA: Upper abdominal pain, nausea, vomiting and diarrhea  with decreased oral intake for the past 5 days  EXAM:  CT ABDOMEN AND PELVIS WITH CONTRAST  TECHNIQUE:  Multidetector CT imaging of the abdomen and pelvis was performed  using the standard protocol following bolus administration of  intravenous contrast.  CONTRAST: 15mL OMNIPAQUE IOHEXOL 300 MG/ML SOLN  COMPARISON: US ABDOMEN COMPLETE dated 07/13/2008  FINDINGS:  Normal hepatic contour. There is diffuse decreased attenuation of  the hepatic  parenchyma suggestive of hepatic steatosis. There is a  minimal amount of focal fatty sparing adjacent to the fissure for  ligamentum teres as well as the gallbladder fossa. No discrete  hepatic lesions. Normal appearance of the gallbladder. No definite  radiopaque gallstones. No intra extrahepatic dilatation. No ascites.  There is symmetric enhancement and excretion of the bilateral  kidneys. Subcentimeter right-sided hypoattenuating renal lesions are  too small to accurately characterize though favored to represent  renal cysts. No definite renal stones on this postcontrast  examination. No urinary obstruction or perinephric stranding. Normal  appearance of the bilateral adrenal glands and pancreas. The spleen  is enlarged measuring 15.9 cm in length. No discrete splenic  lesions. No perisplenic stranding.  Scatter minimal colonic diverticulosis without evidence of  diverticulitis. The bowel is otherwise normal in course and caliber  without wall thickening or evidence of obstruction. Normal  appearance of the appendix. No pneumoperitoneum, pneumatosis or  portal venous gas.  Scattered mixed calcified and noncalcified atherosclerotic plaque  with a normal caliber abdominal  aorta. The major branch vessels of  the abdominal aorta are patent on this non CTA examination.  Incidental note is made of a circumaortic left renal vein. The  portal vein is widely patent.  Shotty porta hepatis lymph nodes with index porta hepatis nodal  conglomeration measuring approximately 1.4 cm in greatest short axis  diameter (image 30, series 2) presumably reactive in etiology.  Otherwise, no retroperitoneal, mesenteric, pelvic or inguinal  lymphadenopathy.  Note is made of a large, approximately 7.3 x 5.5 cm left-sided  adnexal cyst (coronal image 76, series 4). Additionally, there is an  adjacent smaller approximately 2.5 x 1.5 cm left-sided adnexal cyst  (coronal image 85, series 4). No discrete right-sided adnexal  lesions. There is a trace amount of presumed physiologic fluid  within the endometrial canal. No free fluid within the pelvic  cul-de-sac.  Limited visualization of the lower thorax demonstrates minimal  subsegmental atelectasis within in the image caudal aspect of the  lingula. No focal airspace opacities. No pleural effusion.  Normal heart size. No pericardial effusion.  No acute or aggressive osseous abnormalities.  IMPRESSION:  1. No explanation for patient's abdominal pain. Specifically, no  evidence of enteric or urinary obstruction. Normal appearance of the  appendix.  2. Hepatic steatosis. Correlation with LFTs is recommended.  3. Splenomegaly, the etiology of which is not depicted on this  examination.  4. Note is made of two adjacent left-sided presumably physiologic  ovarian cysts, the largest of which measures approximately 7.3 cm in  diameter. Given the size of the dominant cyst, further evaluation  with dedicated pelvic ultrasound in 6 weeks is recommended to ensure  stability and/or resolution   09/2013  CLINICAL DATA: Premature ovarian failure. Cystic adnexal mass seen  on recent CT.  EXAM:  TRANSABDOMINAL AND TRANSVAGINAL ULTRASOUND OF  PELVIS  TECHNIQUE:  Both transabdominal and transvaginal ultrasound examinations of the  pelvis were performed. Transabdominal technique was performed for  global imaging of the pelvis including uterus, ovaries, adnexal  regions, and pelvic cul-de-sac. It was necessary to proceed with  endovaginal exam following the transabdominal exam to visualize the  ovaries and adnexal mass.  COMPARISON: CT on 07/09/2013  FINDINGS:  Uterus  Measurements: 7.0 x 3.2 x 3.8 cm. No fibroids or other mass  visualized.  Endometrium  Thickness: 6 mm. No focal abnormality visualized.  Right ovary  Measurements: Not directly visualized by transabdominal or  transvaginal sonography, however no  adnexal mass identified.  Left ovary  Measurements: No normal ovary visualized. A complex cystic mass is  seen containing multiple septations, a few of which are thickened  measuring up to 5 mm and show internal blood flow on color Doppler  ultrasound. This measures 7.7 by 6.2 x 7.6 cm. This is highly  suspicious for cystic ovarian neoplasm, and malignancy cannot be  excluded.  Other findings  No free fluid.  IMPRESSION:  7.7 cm complex cystic adnexal mass which is highly suspicious for  cystic ovarian neoplasm. Some thickened septations are visualized,  which are worrisome for possible malignancy. Surgical evaluation is  recommended. This recommendation follows the consensus statement:  Management of Asymptomatic Ovarian and Other Adnexal Cysts Imaged at  [place]: Society of Radiologists in [place]. Radiology 2010; [phone].  No evidence of ascites. No other pelvic mass visualized.  Normal appearance of uterus.  CBC    Component  Value  Date/Time    WBC  4.8  10/14/2013 1526    RBC  4.44  10/14/2013 1526    HGB  14.2  10/14/2013 1526    HCT  39.9  10/14/2013 1526    PLT  136*  10/14/2013 1526    MCV  89.9  10/14/2013 1526    MCH  32.0  10/14/2013 1526    MCHC  35.6  10/14/2013 1526     RDW  13.4  10/14/2013 1526    LYMPHSABS  2.2  09/08/2013 1414    MONOABS  0.5  09/08/2013 1414    EOSABS  0.1  09/08/2013 1414    BASOSABS  0.0  09/08/2013 1414       Assessment:   Complex ovarian cyst d/w pt treatment options vs observation. rec removal. Pt and her mother desire to have the cyst adn ovary removed.  Plan:   Patient desires surgical management with left salping oophorectomy with right salpingectomy if still in situ. The risks of surgery were discussed in detail with the patient including but not limited to: bleeding which may require transfusion or reoperation; infection which may require prolonged hospitalization or re-hospitalization and antibiotic therapy; injury to bowel, bladder, ureters and major vessels or other surrounding organs; need for additional procedures including laparotomy; thromboembolic phenomenon, incisional problems and other postoperative or anesthesia complications. Patient was told that the likelihood that her condition and symptoms will be treated effectively with this surgical management was very high; the postoperative expectations were also discussed in detail. The patient also understands the alternative treatment options which were discussed in full. All questions were answered.

## 2013-12-31 ENCOUNTER — Encounter (HOSPITAL_COMMUNITY): Payer: Self-pay | Admitting: Obstetrics & Gynecology

## 2014-01-14 ENCOUNTER — Ambulatory Visit (INDEPENDENT_AMBULATORY_CARE_PROVIDER_SITE_OTHER): Payer: 59 | Admitting: Obstetrics & Gynecology

## 2014-01-14 ENCOUNTER — Encounter: Payer: Self-pay | Admitting: Obstetrics & Gynecology

## 2014-01-14 VITALS — BP 128/87 | HR 87 | Temp 98.3°F | Ht 65.0 in | Wt 160.7 lb

## 2014-01-14 DIAGNOSIS — Z09 Encounter for follow-up examination after completed treatment for conditions other than malignant neoplasm: Secondary | ICD-10-CM

## 2014-01-14 DIAGNOSIS — R300 Dysuria: Secondary | ICD-10-CM

## 2014-01-14 LAB — POCT URINALYSIS DIP (DEVICE)
Bilirubin Urine: NEGATIVE
Glucose, UA: NEGATIVE mg/dL
Ketones, ur: NEGATIVE mg/dL
NITRITE: NEGATIVE
PH: 5.5 (ref 5.0–8.0)
Protein, ur: NEGATIVE mg/dL
Specific Gravity, Urine: 1.015 (ref 1.005–1.030)
UROBILINOGEN UA: 0.2 mg/dL (ref 0.0–1.0)

## 2014-01-14 MED ORDER — CIPROFLOXACIN HCL 500 MG PO TABS: 500.0000 mg | ORAL_TABLET | Freq: Two times a day (BID) | ORAL | Status: DC

## 2014-01-14 NOTE — Patient Instructions (Signed)
Ovarian Cystectomy, Care After  Refer to this sheet in the next few weeks. These instructions provide you with information on caring for yourself after your procedure. Your health care provider may also give you more specific instructions. Your treatment has been planned according to current medical practices, but problems sometimes occur. Call your health care provider if you have any problems or questions after your procedure.   WHAT TO EXPECT AFTER THE PROCEDURE  After your procedure, it is typical to have the following:  · Pain in your abdomen, especially at the incision sites. You will be given pain medicines to control the pain.  · Tiredness. This is a normal part of the recovery process. Your energy level will return to normal over the next several weeks.  · Constipation.  HOME CARE INSTRUCTIONS  · Only take over-the-counter or prescription medicines as directed by your health care provider. Avoid taking aspirin because it can cause bleeding.    · Follow your health care provider's instructions for when to resume your regular diet, exercise, and activities.    · Take rest breaks during the day as needed.  · Do not douche or have sexual intercourse until you have permission from your health care provider.    · Remove or change any bandages (dressings) as directed by your health care provider.    · Do not drive until your health care provider approves.    · Take showers instead of baths until your health care provider tells you otherwise.    · If you become constipated, you may:    ¨ Use a mild laxative if your health care provider approves.  ¨ Add more fruit and bran to your diet.  ¨ Drink more fluids.  · Take your temperature twice a day and record it.    · Do not drink alcohol while taking pain medicine.    · Try to have someone home with you for the first 1-2 weeks to help with your household activities.    · Follow up with your health care provider as directed.  SEEK MEDICAL CARE IF:  · You have a fever.     · You feel sick to your stomach (nauseous) and throw up (vomit).    · You have redness, swelling, or leakage of fluid at the incision site.    · You have pain when you urinate or have blood in your urine.    · You have a rash on your body.    · You have pain or redness where the IV tube was inserted.    · You have pain that is not relieved with medicine.    SEEK IMMEDIATE MEDICAL CARE IF:  · You have chest pain or shortness of breath.    · You feel dizzy or lightheaded.    · You have increasing abdominal pain that is not relieved with medicines.    · You have pain, swelling, or redness in your leg.    · You see a yellowish white fluid (pus) coming from the incision.    · Your incision is opening (edges not staying together).    Document Released: 02/18/2013 Document Reviewed: 02/18/2013  ExitCare® Patient Information ©2015 [employer]. This information is not intended to replace advice given to you by your health care provider. Make sure you discuss any questions you have with your health care provider.

## 2014-01-14 NOTE — Progress Notes (Signed)
Here for post op appointment. States never finished amoxicillin prescribed in June, states took one a few days ago because having a pulling sensation when voids , denies burning. Also c/o it hurts a lot when she needs to have a bowel movement. States it feels very heavy.

## 2014-01-14 NOTE — Progress Notes (Signed)
Subjective:     Patient ID: [name], female   DOB: 12-28-1965, 48 y.o.   MRN: [id number]  HPI Pt reports that she has been having dysuria since her surgery.  She is otherwise without complications.  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  She wants to know if she can begin exercise.      Review of Systems     Objective:   Physical Exam BP 128/87  Pulse 87  Temp(Src) 98.3 F (36.8 C)  Ht 5\' 5"  (1.651 m)  Wt 160 lb 11.2 oz (72.893 kg)  BMI 26.74 kg/m2 Abd: soft, NT, ND.  All ports sites clean/dry/intact  12/30/2013 Diagnosis Ovary and fallopian tube, left - BENIGN SEROUS CYST. - BENIGN FALLOPIAN TUBE.    Assessment:     2 week post op check- doing well- reviewed pathology UTI      Plan:     cipro 500mg  bid x 7 days F/u `4 weeks or sooner prn Gradual return to full activities May RTW.

## 2014-02-26 ENCOUNTER — Ambulatory Visit: Payer: Medicare Other | Admitting: Obstetrics and Gynecology

## 2014-03-08 ENCOUNTER — Ambulatory Visit: Payer: Medicare Other | Admitting: Obstetrics & Gynecology

## 2014-03-08 ENCOUNTER — Telehealth: Payer: Self-pay | Admitting: General Practice

## 2014-03-08 NOTE — Telephone Encounter (Signed)
Patient no showed for appt today. Called patient and she states she didn't have our number but called somewhere and told someone she couldn't make it because of car trouble and they said they would pass the message along. Told patient I will let our front office know she needs this appt rescheduled. Patient verbalized understanding and had no other questions

## 2014-03-15 ENCOUNTER — Encounter: Payer: Self-pay | Admitting: Obstetrics & Gynecology

## 2014-04-16 ENCOUNTER — Telehealth: Payer: Self-pay | Admitting: General Practice

## 2014-04-16 ENCOUNTER — Ambulatory Visit: Payer: 59 | Admitting: Obstetrics & Gynecology

## 2014-04-16 ENCOUNTER — Encounter: Payer: Self-pay | Admitting: General Practice

## 2014-04-16 NOTE — Telephone Encounter (Signed)
Patient no showed for appt today. Called patient, no answer- left message stating we are calling in regards to the appt you missed today, please call our front office staff to reschedule this appt. Will send letter

## 2014-11-25 ENCOUNTER — Encounter: Payer: Self-pay | Admitting: Family Medicine

## 2014-11-25 ENCOUNTER — Ambulatory Visit (INDEPENDENT_AMBULATORY_CARE_PROVIDER_SITE_OTHER): Payer: BLUE CROSS/BLUE SHIELD | Admitting: Family Medicine

## 2014-11-25 VITALS — BP 140/76 | HR 81 | Temp 98.1°F | Wt 162.0 lb

## 2014-11-25 DIAGNOSIS — G40909 Epilepsy, unspecified, not intractable, without status epilepticus: Secondary | ICD-10-CM | POA: Diagnosis not present

## 2014-11-25 MED ORDER — DIAZEPAM 10 MG RE GEL: 10.0000 mg | Freq: Once | RECTAL | Status: DC | PRN

## 2014-11-25 NOTE — Progress Notes (Signed)
Subjective: [name] is a 49 y.o. female presenting for seizure-like activity.  Most of the history is provided by her best friend due to the patient not remembering anything. Approximately 2.5 hours ago, she became very rigid and unresponsive with wide open eyes as she was sitting down at a table to eat. Her friend lowered her to the ground slowly. She was then convulsing for about a total of 30 seconds then she was confused and drowsy. She had urinary incontinence but no respiratory distress/frothing. She's been light-headed, nauseous, and continues to have these symptoms now. Sustained small scratch on her chin but no other trauma.   This has happened once before in early May witnessed by her friend. She sought no care at that time. Both of these episodes occurred while at [employer] which is like a casino with many flashing lights.   No recent illnesses, no EtOH,no drugs, no personal or family history of diagnosed seizure disorders  Objective: BP 140/76 mmHg  Pulse 81  Temp(Src) 98.1 F (36.7 C) (Oral)  Wt 162 lb (73.483 kg) Gen: Drowsy 49 y.o. female in no distress HEENT: Tongue normal, fair dentition. Very superficial linear lacerations to anterior chin. Hemostatic.  Neuro: Alert and oriented x4, CN II-XII without deficits, no focal deficits in sensation or motor function, patellar DTRs 2+ bilaterally. No dysmetria or dysdiadochokinesia. Fluent speech without aphasia. No pronator drift.  Assessment/Plan: [name] is a 49 y.o. female here for recurrent seizure.  See problem list for plan.

## 2014-11-25 NOTE — Patient Instructions (Addendum)
Expect a call from the neurologist's office. Fill the prescription for diastat to be used only if a seizure continues and call 911 at that time. No driving, swimming or getting on ladders for now or probably the next 6 months. Tell all medical providers that you likely have a seizure disorder.

## 2014-11-25 NOTE — Addendum Note (Signed)
Addended by: Vance Gather B on: 11/25/2014 02:56 PM   Modules accepted: Orders

## 2014-11-25 NOTE — Assessment & Plan Note (Signed)
2nd tonic-clonic seeming seizure without status. Somewhat postictal now without deficits or signs of significant trauma. Refer to neurology for EEG, treatment. Rx diastat x1 prn extended seizure. Seizure precautions reviewed including no driving, swimming alone, climbing, etc.

## 2014-11-29 DIAGNOSIS — R231 Pallor: Secondary | ICD-10-CM | POA: Diagnosis not present

## 2014-11-29 DIAGNOSIS — R6884 Jaw pain: Secondary | ICD-10-CM | POA: Diagnosis not present

## 2014-11-29 DIAGNOSIS — M0589 Other rheumatoid arthritis with rheumatoid factor of multiple sites: Secondary | ICD-10-CM | POA: Diagnosis not present

## 2014-11-29 DIAGNOSIS — R569 Unspecified convulsions: Secondary | ICD-10-CM | POA: Diagnosis not present

## 2014-12-13 ENCOUNTER — Encounter: Payer: Self-pay | Admitting: Neurology

## 2014-12-13 ENCOUNTER — Ambulatory Visit (INDEPENDENT_AMBULATORY_CARE_PROVIDER_SITE_OTHER): Payer: BLUE CROSS/BLUE SHIELD | Admitting: Neurology

## 2014-12-13 VITALS — BP 118/76 | HR 72 | Resp 16 | Ht 64.0 in | Wt 164.0 lb

## 2014-12-13 DIAGNOSIS — R569 Unspecified convulsions: Secondary | ICD-10-CM | POA: Diagnosis not present

## 2014-12-13 DIAGNOSIS — R55 Syncope and collapse: Secondary | ICD-10-CM

## 2014-12-13 NOTE — Patient Instructions (Signed)
1. Schedule MRI brain with and without contrast 2. Schedule 1-hour sleep deprived EEG 3. Schedule 2-D echocardiogram 4. Follow-up after tests  Seizure Precautions: 1. If medication has been prescribed for you to prevent seizures, take it exactly as directed.  Do not stop taking the medicine without talking to your doctor first, even if you have not had a seizure in a long time.   2. Avoid activities in which a seizure would cause danger to yourself or to others.  Don't operate dangerous machinery, swim alone, or climb in high or dangerous places, such as on ladders, roofs, or girders.  Do not drive unless your doctor says you may.  3. If you have any warning that you may have a seizure, lay down in a safe place where you can't hurt yourself.    4.  No driving for 6 months from last seizure, as per [facility].   Please refer to the following link on the [place] website for more information: http://www.epilepsyfoundation.org/answerplace/Social/driving/drivingu.cfm   5.  Maintain good sleep hygiene.  6.  Contact your doctor if you have any problems that may be related to the medicine you are taking.  7.  Call 911 and bring the patient back to the ED if:        A.  The seizure lasts longer than 5 minutes.       B.  The patient doesn't awaken shortly after the seizure  C.  The patient has new problems such as difficulty seeing, speaking or moving  D.  The patient was injured during the seizure  E.  The patient has a temperature over 102 F (39C)  F.  The patient vomited and now is having trouble breathing

## 2014-12-13 NOTE — Progress Notes (Signed)
NEUROLOGY CONSULTATION NOTE  [name] MRN: [id number] DOB: 1966-01-20  Referring provider: Dr. Vance Oliver Primary care provider: Dr. Smitty Oliver  Reason for consult:  seizures  Dear Dr [name]:  Thank you for your kind referral of [name] for consultation of the above symptoms. Although her history is well known to you, please allow me to reiterate it for the purpose of our medical record. Records and images were personally reviewed where available.  HISTORY OF PRESENT ILLNESS: This is a pleasant 49 year old right-handed woman with a history of rheumatoid arthritis, presenting for evaluation of convulsions. The first episode occurred early May 2016, she [place], she had been standing for a while and was getting ready to sit when she started feeling funny like she would pass out. She felt like she could not catch her breath. She then lost consciousness and was witnessed to make a snorting sound, followed by convulsive activity lasting 30 seconds. This was associated with urinary incontinence. When she came to, she felt confused and could hear her name being called from a distance. She was evaluated by EMS and noted to have stable vital signs and declined going to the ER. The next day she had pink eye and attributed symptoms to that. On 11/25/14, she was with her friend and hit her left knee, recalling she was in severe pain, she bent down to look at her knee and recalls feeling the same sensation that she could not catch her breath, then passed out and started having a convulsion. Her friend reported she was very rigid and unresponsive with eyes wide open, lasting 30-60 seconds. She was confused and drowsy after, with note of urinary incontinence. She saw her PCP and was given a prescription for prn Diastat. She reports a milder episode around 8 months ago, they were again at the same casino when she started feeling like something was going on,  she couldn't catch her breath and breathing felt irregular. She felt like she would pass out, went outside, and started feeling better.   She denies any post-event focal weakness. She reports she had eaten those days, and denied any alcohol or sleep deprivation. She denies any staring/unresponsive episodes, gaps in time, olfactory/gustatory hallucinations, deja vu, rising epigastric sensation, focal numbness/tingling/weakness, myoclonic jerks. She denies any headaches, dizziness, diplopia, dysarthria, dysphagia, neck/back pain, bowel/bladder dysfunction. She has been unable to hear much out of her left ear for the past 6-7 months, no ear pain or discharge. She occasionally feels unbalanced, no head injuries. She  had a normal birth and early development.  There is no history of febrile convulsions, CNS infections such as meningitis/encephalitis, significant traumatic brain injury, neurosurgical procedures, or family history of seizures. She had been on Enbrel for many years, stopped recently due to the convulsions.   Laboratory Data: 05/2014 normal CBC, CMP, ESR, CRP  PAST MEDICAL HISTORY: Past Medical History  Diagnosis Date  . Ectopic pregnancy   . Kidney stones     "passed them"  . Rheumatoid arthritis     PAST SURGICAL HISTORY: Past Surgical History  Procedure Laterality Date  . Ectopic pregnancy surgery  1990's  . Hip surgery Bilateral 2000's    "had fluid in them"  . Salpingoophorectomy  1990's    "related to ectopic pregnancy"  . Laparoscopic salpingo oopherectomy Left 12/30/2013    Procedure: LEFT SALPINGOOPHORECTOMY;  Surgeon: Lavonia Drafts, MD;  Location: [facility];  Service: Gynecology;  Laterality: Left;  BILATERAL SALPINGECTOMY  . Laparoscopic salpingo oopherectomy Right 2000    MEDICATIONS: Current Outpatient Prescriptions on File Prior to Visit  Medication Sig Dispense Refill  . diazepam (DIASTAT ACUDIAL) 10 MG GEL Place 10 mg rectally once as needed (sustained  seizure). (Patient not taking: Reported on 12/13/2014) 1 Package 1  . etanercept (ENBREL) 50 MG/ML injection Inject 50 mg into the skin once a week. On Fridays     No current facility-administered medications on file prior to visit.    ALLERGIES: Allergies  Allergen Reactions  . Celebrex [Celecoxib] Hives  . Sulfur Hives    FAMILY HISTORY: Family History  Problem Relation Age of Onset  . Arthritis Mother   . Hypertension Mother   . Cancer Neg Hx     SOCIAL HISTORY: History   Social History  . Marital Status: Married    Spouse Name: N/A  . Number of Children: 1  . 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 Smoking status: Never Smoker   . Smokeless tobacco: Never Used  . Alcohol Use: No  . Drug Use: Yes    Special: Marijuana     Comment: 1 jt per day at night  . Sexual Activity: Not Currently   Other Topics Concern  . Not on file   Social History Narrative    REVIEW OF SYSTEMS: Constitutional: No fevers, chills, or sweats, no generalized fatigue, change in appetite Eyes: No visual changes, double vision, eye pain Ear, nose and throat: No hearing loss, ear pain, nasal congestion, sore throat Cardiovascular: No chest pain, palpitations Respiratory:  No shortness of breath at rest or with exertion, wheezes GastrointestinaI: No nausea, vomiting, diarrhea, abdominal pain, fecal incontinence Genitourinary:  No dysuria, urinary retention or frequency Musculoskeletal:  No neck pain, back pain Integumentary: No rash, pruritus, skin lesions Neurological: as above Psychiatric: No depression, insomnia, anxiety Endocrine: No palpitations, fatigue, diaphoresis, mood swings, change in appetite, change in weight, increased thirst Hematologic/Lymphatic:  No anemia, purpura, petechiae. Allergic/Immunologic: no itchy/runny eyes, nasal congestion, recent allergic reactions, rashes  PHYSICAL EXAM: Filed  Vitals:   12/13/14 1508  BP: 118/76  Pulse: 72  Resp: 16   General: No acute distress Head:  Normocephalic/atraumatic Eyes: Fundoscopic exam shows bilateral sharp discs, no vessel changes, exudates, or hemorrhages Neck: supple, no paraspinal tenderness, full range of motion Back: No paraspinal tenderness Heart: regular rate and rhythm Lungs: Clear to auscultation bilaterally. Vascular: No carotid bruits. Skin/Extremities: No rash, no edema. There are large subcutaneous nodules over the elbows  Neurological Exam: Mental status: alert and oriented to person, place, and time, no dysarthria or aphasia, Fund of knowledge is appropriate.  Recent and remote memory are intact. 3/3 delayed recall.  Attention and concentration are normal.    Able to name objects and repeat phrases. Cranial nerves: CN I: not tested CN II: pupils equal, round and reactive to light, visual fields intact, fundi unremarkable. CN III, IV, VI:  full range of motion, no nystagmus, no ptosis CN V: facial sensation intact CN VII: upper and lower face symmetric CN VIII: hearing intact to finger rub CN IX, X: gag intact, uvula midline CN XI: sternocleidomastoid and trapezius muscles intact CN XII: tongue midline Bulk & Tone: normal, no fasciculations. Motor: 5/5 throughout with no pronator drift. Sensation: intact to light touch, cold, pin, vibration and joint position sense.  No extinction to double simultaneous stimulation.  Romberg test negative Deep  Tendon Reflexes: +2 throughout, no ankle clonus Plantar responses: downgoing bilaterally Cerebellar: no incoordination on finger to nose, heel to shin. No dysdiadochokinesia Gait: narrow-based and steady, able to tandem walk adequately. Tremor: none  IMPRESSION: This is a pleasant 49 year old right-handed woman with a history of rheumatoid arthritis, presenting for episodes of convulsive activity. She reports a presyncopal sensation prior, and the last episode occurred  after she hit her knee and had severe pain (?vasovagal). The etiology of her symptoms is unclear, convulsive syncope versus seizure. Her neurological exam is normal, no clear epilepsy risk factors. MRI brain with and without contrast and a 1-hour sleep deprived EEG will be ordered to assess for focal abnormalities that increase risk for recurrent seizures. Echocardiogram will be ordered as part of syncope workup. No clear indication to start anti-epileptic medication at this point. [place] driving laws were discussed with the patient, and she knows to stop driving after an episode of loss of consciousness, until 6 months event-free. She will follow-up after the tests and knows to call our office for any questions.   Thank you for allowing me to participate in the care of this patient. Please do not hesitate to call for any questions or concerns.   Ellouise Newer, M.D.  CC: Dr. Juanito Doom, Dr. Bonner Oliver

## 2014-12-16 ENCOUNTER — Ambulatory Visit (INDEPENDENT_AMBULATORY_CARE_PROVIDER_SITE_OTHER): Payer: BLUE CROSS/BLUE SHIELD | Admitting: Neurology

## 2014-12-16 DIAGNOSIS — R569 Unspecified convulsions: Secondary | ICD-10-CM | POA: Diagnosis not present

## 2014-12-16 DIAGNOSIS — R55 Syncope and collapse: Secondary | ICD-10-CM | POA: Diagnosis not present

## 2014-12-17 ENCOUNTER — Telehealth: Payer: Self-pay | Admitting: Neurology

## 2014-12-17 NOTE — Telephone Encounter (Signed)
No answer. Wanted to discuss EEG findings. EEG was abnormal, showing underlying tendency for recurrent seizures. Will await brain MRI scheduled for Monday, but as we discussed in the office that if EEG is abnormal, would start a seizure medication. The seizure medication is Keppra, start 500mg  BID. Side effects may include drowsiness or irritability.

## 2014-12-17 NOTE — Telephone Encounter (Signed)
Called patient back. She had questions if the Keppra would effect her taking Enbrel in any way. I did tell her that Dr. Delice Lesch could address that with her when she spoke with her next week.

## 2014-12-17 NOTE — Telephone Encounter (Signed)
Pt returned call for message of test results/ call back @ [phone]

## 2014-12-17 NOTE — Telephone Encounter (Signed)
I spoke with her about results & advisement of [place]. She is going to research med and would like to speak with you about everything once you have reviewed her MRI results.

## 2014-12-17 NOTE — Telephone Encounter (Signed)
VM-Pt called and left a message to have Dr Aquino's assistant call her back/Dawn CB# [phone]

## 2014-12-20 ENCOUNTER — Ambulatory Visit (HOSPITAL_COMMUNITY)
Admission: RE | Admit: 2014-12-20 | Discharge: 2014-12-20 | Disposition: A | Discharge status: Home / Self Care | Payer: BLUE CROSS/BLUE SHIELD | Source: Ambulatory Visit | Attending: Neurology | Admitting: Neurology

## 2014-12-20 DIAGNOSIS — J329 Chronic sinusitis, unspecified: Secondary | ICD-10-CM | POA: Insufficient documentation

## 2014-12-20 DIAGNOSIS — R55 Syncope and collapse: Secondary | ICD-10-CM | POA: Insufficient documentation

## 2014-12-20 DIAGNOSIS — R569 Unspecified convulsions: Secondary | ICD-10-CM | POA: Insufficient documentation

## 2014-12-20 DIAGNOSIS — R410 Disorientation, unspecified: Secondary | ICD-10-CM | POA: Diagnosis not present

## 2014-12-20 MED ORDER — GADOBENATE DIMEGLUMINE 529 MG/ML IV SOLN
15.0000 mL | Freq: Once | INTRAVENOUS | Status: AC | PRN
  Administered 2014-12-20: 15 mL via INTRAVENOUS

## 2014-12-21 ENCOUNTER — Telehealth: Payer: Self-pay | Admitting: Neurology

## 2014-12-21 DIAGNOSIS — G40309 Generalized idiopathic epilepsy and epileptic syndromes, not intractable, without status epilepticus: Secondary | ICD-10-CM

## 2014-12-21 MED ORDER — LEVETIRACETAM 500 MG PO TABS: ORAL_TABLET | ORAL | Status: DC

## 2014-12-21 NOTE — Telephone Encounter (Signed)
Reviewed MRI brain, normal. Discussed findings with patient, discussed abnormal EEG, and recommendation to start medication. Discussed side effects of Keppra, start 500mg  BID. She asked if this would interfere with pot, which she does every night to help with her legs. Told patient that there are reports that marijuana can cause seizures, as well as cognitive side effects, but would not interact with Keppra. She will f/u as scheduled on 01/13/15 and knows to call our office for any questions in the interim.

## 2014-12-22 ENCOUNTER — Other Ambulatory Visit: Payer: Self-pay | Admitting: Neurology

## 2014-12-22 ENCOUNTER — Other Ambulatory Visit: Payer: Self-pay

## 2014-12-22 ENCOUNTER — Ambulatory Visit (HOSPITAL_COMMUNITY): Payer: BLUE CROSS/BLUE SHIELD | Attending: Cardiology

## 2014-12-22 DIAGNOSIS — I517 Cardiomegaly: Secondary | ICD-10-CM | POA: Diagnosis not present

## 2014-12-22 DIAGNOSIS — R55 Syncope and collapse: Secondary | ICD-10-CM | POA: Diagnosis present

## 2014-12-23 ENCOUNTER — Telehealth: Payer: Self-pay | Admitting: Family Medicine

## 2014-12-23 NOTE — Telephone Encounter (Signed)
Lmovm to return my call. 

## 2014-12-23 NOTE — Telephone Encounter (Signed)
-----   Message from Cameron Sprang, MD sent at 12/23/2014 10:01 AM EDT ----- Pls let her know the echocardiogram overall looks good. Thanks

## 2014-12-23 NOTE — Telephone Encounter (Signed)
Patient returned my call. Notified her of results.

## 2015-01-13 ENCOUNTER — Encounter: Payer: Self-pay | Admitting: Neurology

## 2015-01-13 ENCOUNTER — Ambulatory Visit (INDEPENDENT_AMBULATORY_CARE_PROVIDER_SITE_OTHER): Payer: BLUE CROSS/BLUE SHIELD | Admitting: Neurology

## 2015-01-13 VITALS — BP 122/82 | HR 70 | Resp 16 | Ht 64.0 in | Wt 158.0 lb

## 2015-01-13 DIAGNOSIS — G40309 Generalized idiopathic epilepsy and epileptic syndromes, not intractable, without status epilepticus: Secondary | ICD-10-CM | POA: Insufficient documentation

## 2015-01-13 MED ORDER — LEVETIRACETAM 500 MG PO TABS
ORAL_TABLET | ORAL | Status: DC
Start: 2015-01-13 — End: 2015-05-17

## 2015-01-13 NOTE — Progress Notes (Signed)
NEUROLOGY FOLLOW UP OFFICE NOTE  BERNESTINE HOLSAPPLE [id number]  HISTORY OF PRESENT ILLNESS: I had the pleasure of seeing Clydell Alberts in follow-up in the neurology clinic on 01/13/2015.  The patient was last seen 1 month ago for recurrent convulsions that started in May 2016. She would have a presyncopal sensation prior, and one time she was in severe pain then passed out and had a convulsion. Records and images were personally reviewed where available. I personally reviewed MRI brain with and without contrast which was normal. Her 1-hour EEG was abnormal with frequent bursts of generalized irregular high voltage 4-5 Hz activity with embedded spike and sharp waves seen in drowsiness and sleep. There was occasional focal slowing seen over the left temporal region. Findings were discussed over the phone, and she started Keppra 538m BID a month ago with no side effects. She denies any convulsions since 11/25/14. She denies any headaches, dizziness, diplopia, focal numbness/tingling/weakness, olfactory/gustatory hallucinations. Memory is good. She is not driving.  HPI: This is a pleasant 49yo RH woman with a history of rheumatoid arthritis, with recurrent convulsions since May 2016. She was at a casino with blinking lights, she had been standing for a while and was getting ready to sit when she started feeling funny like she would pass out. She felt like she could not catch her breath. She then lost consciousness and was witnessed to make a snorting sound, followed by convulsive activity lasting 30 seconds. This was associated with urinary incontinence. When she came to, she felt confused and could hear her name being called from a distance. She was evaluated by EMS and noted to have stable vital signs and declined going to the ER. The next day she had pink eye and attributed symptoms to that. On 11/25/14, she was with her friend and hit her left knee, recalling she was in severe pain, she bent down to  look at her knee and recalls feeling the same sensation that she could not catch her breath, then passed out and started having a convulsion. Her friend reported she was very rigid and unresponsive with eyes wide open, lasting 30-60 seconds. She was confused and drowsy after, with note of urinary incontinence. She saw her PCP and was given a prescription for prn Diastat. She reports a milder episode around 8 months ago, they were again at the same casino when she started feeling like something was going on, she couldn't catch her breath and breathing felt irregular. She felt like she would pass out, went outside, and started feeling better.   Epilepsy Risk Factors: She had a normal birth and early development. There is no history of febrile convulsions, CNS infections such as meningitis/encephalitis, significant traumatic brain injury, neurosurgical procedures, or family history of seizures. She had been on Enbrel for many years, stopped recently due to the convulsions.   Laboratory Data: 05/2014 normal CBC, CMP, ESR, CRP Echocardiogram was normal, EF 55-60%.  PAST MEDICAL HISTORY: Past Medical History  Diagnosis Date  . Ectopic pregnancy   . Kidney stones     "passed them"  . Rheumatoid arthritis     MEDICATIONS: Current Outpatient Prescriptions on File Prior to Visit  Medication Sig Dispense Refill  . levETIRAcetam (KEPPRA) 500 MG tablet Take 1 tablet twice a day 60 tablet 4  . diazepam (DIASTAT ACUDIAL) 10 MG GEL Place 10 mg rectally once as needed (sustained seizure). (Patient not taking: Reported on 12/13/2014) 1 Package 1   No current facility-administered medications on  file prior to visit.    ALLERGIES: Allergies  Allergen Reactions  . Celebrex [Celecoxib] Hives  . Sulfur Hives    FAMILY HISTORY: Family History  Problem Relation Age of Onset  . Arthritis Mother   . Hypertension Mother   . Cancer Neg Hx     SOCIAL HISTORY: Social History   Social History  . Marital  Status: Married    Spouse Name: N/A  . Number of Children: 1  . 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 Smoking status: Never Smoker   . Smokeless tobacco: Never Used  . Alcohol Use: No  . Drug Use: Yes    Special: Marijuana     Comment: 1 jt per day at night  . Sexual Activity: Not Currently   Other Topics Concern  . Not on file   Social History Narrative    REVIEW OF SYSTEMS: Constitutional: No fevers, chills, or sweats, no generalized fatigue, change in appetite Eyes: No visual changes, double vision, eye pain Ear, nose and throat: No hearing loss, ear pain, nasal congestion, sore throat Cardiovascular: No chest pain, palpitations Respiratory:  No shortness of breath at rest or with exertion, wheezes GastrointestinaI: No nausea, vomiting, diarrhea, abdominal pain, fecal incontinence Genitourinary:  No dysuria, urinary retention or frequency Musculoskeletal:  No neck pain, back pain Integumentary: No rash, pruritus, skin lesions Neurological: as above Psychiatric: No depression, insomnia, anxiety Endocrine: No palpitations, fatigue, diaphoresis, mood swings, change in appetite, change in weight, increased thirst Hematologic/Lymphatic:  No anemia, purpura, petechiae. Allergic/Immunologic: no itchy/runny eyes, nasal congestion, recent allergic reactions, rashes  PHYSICAL EXAM: Filed Vitals:   01/13/15 1006  BP: 122/82  Pulse: 70  Resp: 16   General: No acute distress Head:  Normocephalic/atraumatic Neck: supple, no paraspinal tenderness, full range of motion Heart:  Regular rate and rhythm Lungs:  Clear to auscultation bilaterally Back: No paraspinal tenderness Skin/Extremities: No rash, no edema Neurological Exam: alert and oriented to person, place, and time. No aphasia or dysarthria. Fund of knowledge is appropriate.  Recent and remote memory are intact. 3/3 delayed recall. Attention  and concentration are normal.    Able to name objects and repeat phrases. Cranial nerves: Pupils equal, round, reactive to light.  Fundoscopic exam unremarkable, no papilledema. Extraocular movements intact with no nystagmus. Visual fields full. Facial sensation intact. No facial asymmetry. Tongue, uvula, palate midline.  Motor: Bulk and tone normal, muscle strength 5/5 throughout with no pronator drift.  Sensation to light touch intact.  No extinction to double simultaneous stimulation.  Deep tendon reflexes 2+ throughout, toes downgoing.  Finger to nose testing intact.  Gait narrow-based and steady, able to tandem walk adequately.  Romberg negative.  IMPRESSION: This is a pleasant 49 yo RH woman with a history of rheumatoid arthritis, with 2 convulsions since May 2016. The last convulsion was on 11/25/14. She would feel a presyncopal sensation prior. 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 There is occasional focal slowing over the left temporal region, raising the possibility of secondary bisynchrony. MRI brain normal. She is now on Keppra 525m BID with no convulsions in almost 2 months and no side effects. Continue current dose for now. We discussed avoidance of seizure triggers, including missing medications, alcohol, and sleep deprivation. [place] driving laws were again discussed with the patient, and she knows to stop driving after an episode of loss of consciousness, until 6 months event-free.  She will follow-up in 4 months and knows to call our office for any changes.   Thank you for allowing me to participate in her care.  Please do not hesitate to call for any questions or concerns.  The duration of this appointment visit was 24 minutes of face-to-face time with the patient.  Greater than 50% of this time was spent in counseling, explanation of diagnosis, planning of further management, and coordination of care.   Ellouise Newer, M.D.   CC: Dr. Juanito Doom

## 2015-01-13 NOTE — Patient Instructions (Signed)
1. Continue Keppra 500mg twice a day 2. Follow-up in 4 months, call for any changes  Seizure Precautions: 1. If medication has been prescribed for you to prevent seizures, take it exactly as directed.  Do not stop taking the medicine without talking to your doctor first, even if you have not had a seizure in a long time.   2. Avoid activities in which a seizure would cause danger to yourself or to others.  Don't operate dangerous machinery, swim alone, or climb in high or dangerous places, such as on ladders, roofs, or girders.  Do not drive unless your doctor says you may.  3. If you have any warning that you may have a seizure, lay down in a safe place where you can't hurt yourself.    4.  No driving for 6 months from last seizure, as per [place] state law.   Please refer to the following link on the Epilepsy Foundation of [employer]'s website for more information: http://www.epilepsyfoundation.org/answerplace/Social/driving/drivingu.cfm   5.  Maintain good sleep hygiene. Avoid alcohol.  6.  Contact your doctor if you have any problems that may be related to the medicine you are taking.  7.  Call 911 and bring the patient back to the ED if:        A.  The seizure lasts longer than 5 minutes.       B.  The patient doesn't awaken shortly after the seizure  C.  The patient has new problems such as difficulty seeing, speaking or moving  D.  The patient was injured during the seizure  E.  The patient has a temperature over 102 F (39C)  F.  The patient vomited and now is having trouble breathing         

## 2015-01-13 NOTE — Procedures (Signed)
ELECTROENCEPHALOGRAM REPORT  Date of Study: 12/16/2014  Patient's Name: [name] MRN: [id number] Date of Birth: Jun 17, 1965  Referring Provider: Dr. Ellouise Newer  Clinical History: This is a 49 year old woman with new onset recurrent convulsions.  Medications: none  Technical Summary: A multichannel digital 1-hour sleep deprived EEG recording measured by the international 10-20 system with electrodes applied with paste and impedances below 5000 ohms performed in our laboratory with EKG monitoring in an awake and asleep patient.  Hyperventilation and photic stimulation were performed.  The digital EEG was referentially recorded, reformatted, and digitally filtered in a variety of bipolar and referential montages for optimal display.    Description: The patient is awake and asleep during the recording.  During maximal wakefulness, there is a symmetric, medium voltage 10 Hz posterior dominant rhythm that attenuates with eye opening.  There is occasional focal 5 Hz slowing seen over the left temporal region. During drowsiness and sleep, there is an increase in theta slowing of the background.  Vertex waves and symmetric sleep spindles were seen. There are frequent bursts of generalized irregular high voltage 4-5 Hz theta activity with embedded spike and sharp waves seen, lasting 1 to 2.5 seconds.  Hyperventilation and photic stimulation did not elicit any abnormalities.  There were no electrographic seizures seen.    EKG lead was unremarkable.  Impression: This 1-hour awake and asleep EEG is abnormal due to the presence of: 1. Occasional focal slowing over the left temporal region 2. Frequent bursts of generalized irregular 4-5 Hz activity with embedded spike and sharp waves seen in drowsiness and sleep  Clinical Correlation: The findings above are consistent with a generalized epilepsy. The focal slowing over the left temporal region indicates focal cerebral dysfunction suggestive of  underlying structural or physiologic abnormality, also raising the possibility of secondary bisynchrony.   Ellouise Newer, M.D.

## 2015-04-25 IMAGING — DX DG CHEST 1V PORT
1 series · 1 of 1 positions shown · non-contrast
Comparison: None.

CLINICAL DATA: Cough

EXAM:
PORTABLE CHEST - 1 VIEW

[portable]
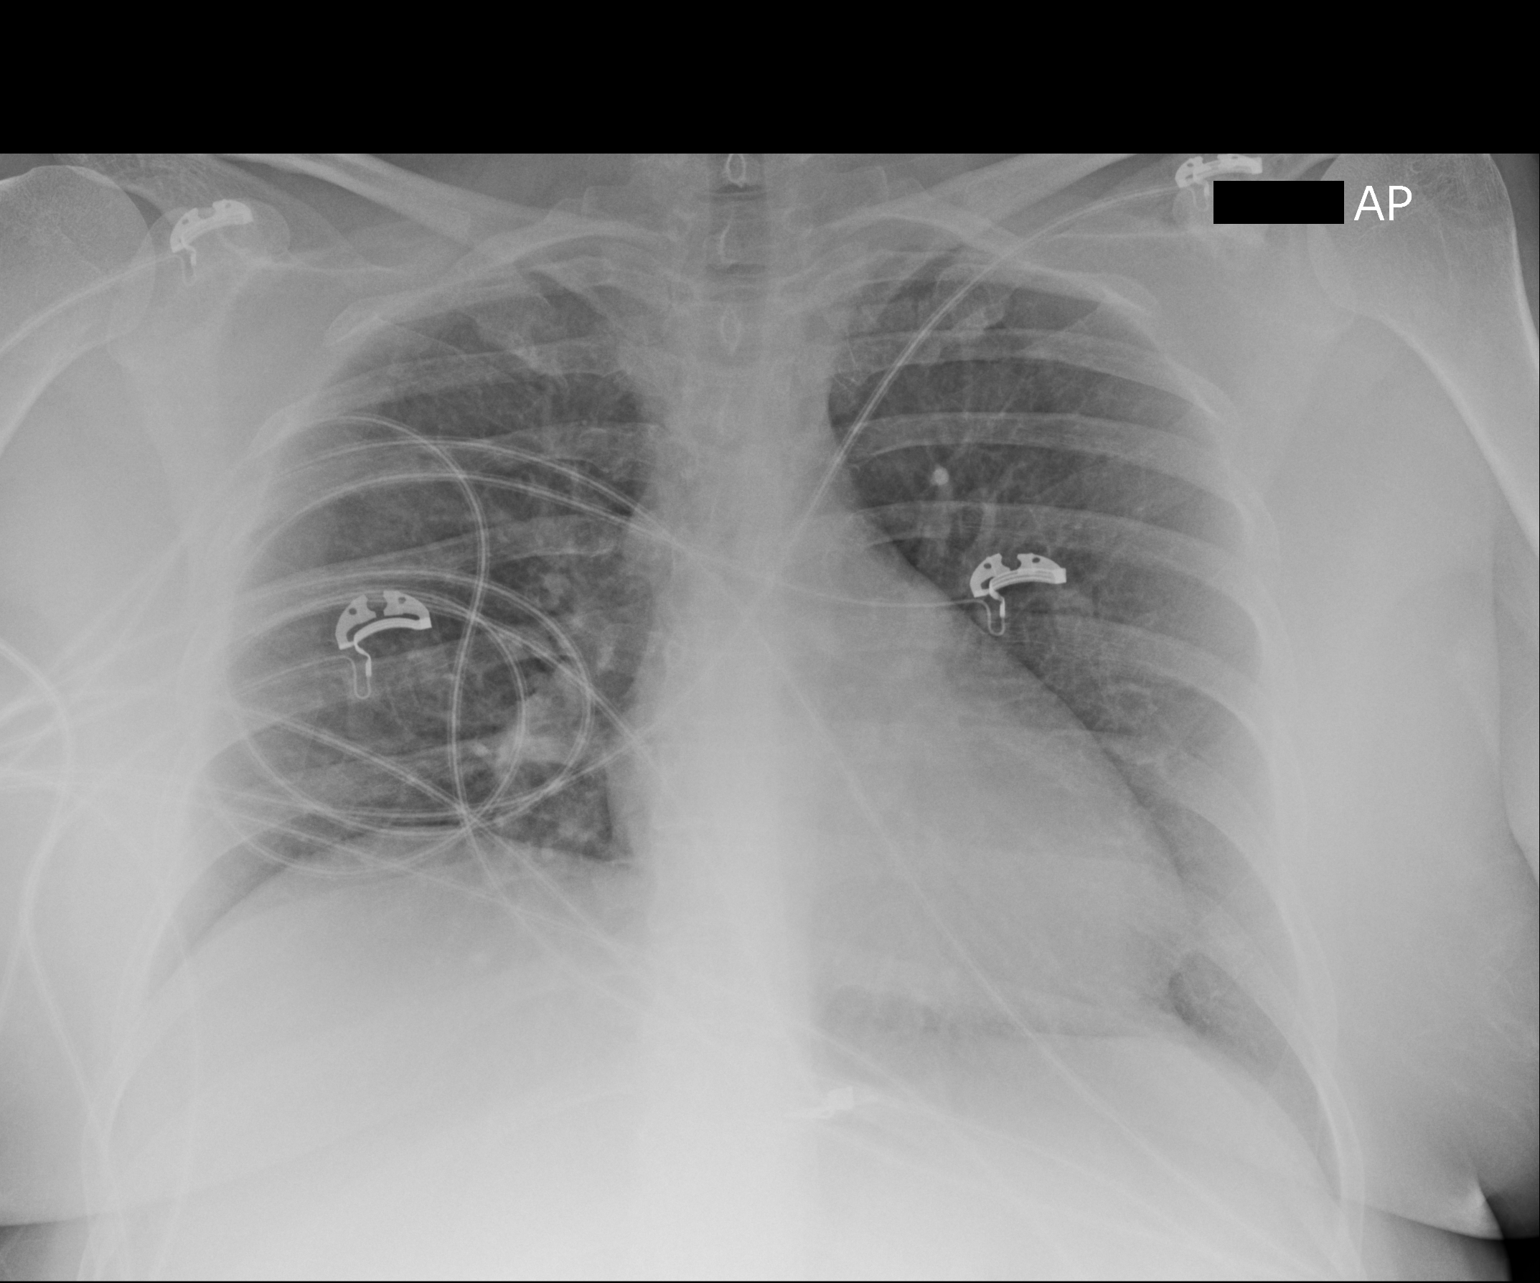

[1 of 1 positions shown; findings below may reference images not displayed]

FINDINGS: Cardiomediastinal silhouette is unremarkable. Mild elevation of the
right hemidiaphragm. No acute infiltrate or pleural effusion. No
pulmonary edema. Mild perihilar increased bronchial markings without
focal consolidation.
IMPRESSION: No acute infiltrate or pulmonary edema. Mild perihilar increased
bronchial markings without focal consolidation

## 2015-04-25 IMAGING — CT CT ABD-PELV W/ CM
2 of 5 series · 14 of 46 positions shown, 16 images · IV contrast (APPLIED)
Comparison: US ABDOMEN COMPLETE dated 07/13/2008

CLINICAL DATA: Upper abdominal pain, nausea, vomiting and diarrhea
with decreased oral intake for the past 5 days

EXAM:
CT ABDOMEN AND PELVIS WITH CONTRAST
TECHNIQUE: Multidetector CT imaging of the abdomen and pelvis was performed
using the standard protocol following bolus administration of
intravenous contrast.
CONTRAST:  100mL OMNIPAQUE IOHEXOL 300 MG/ML  SOLN

[Series 2: abd/ pelvis 5.0 i30f 1 · axial · 0.81mm/px · z∈[-1076,-626]mm · 11 of 100 slices shown, 13 images]
[im 5/100  soft-tissue]
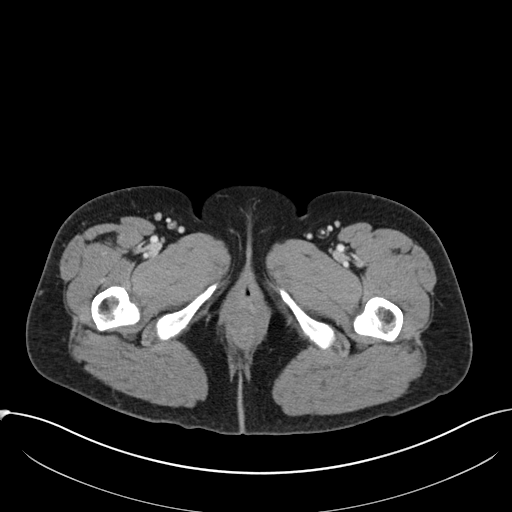
[im 5/100  bone]
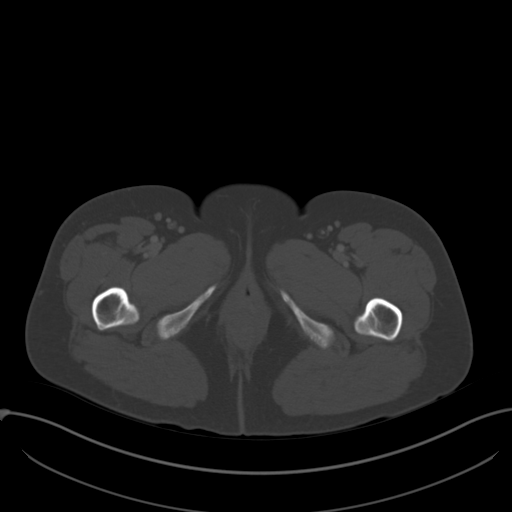
[im 15/100  soft-tissue]
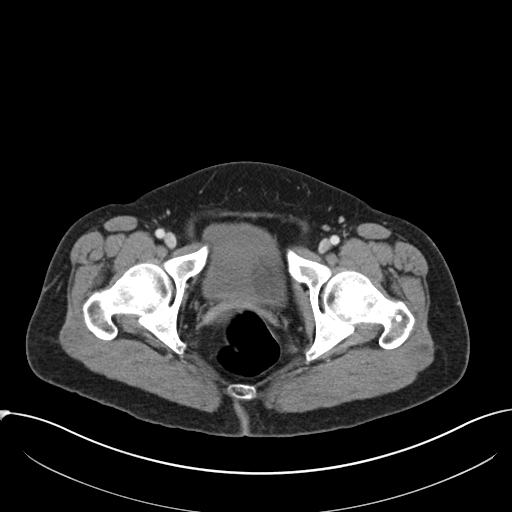
[im 25/100  soft-tissue]
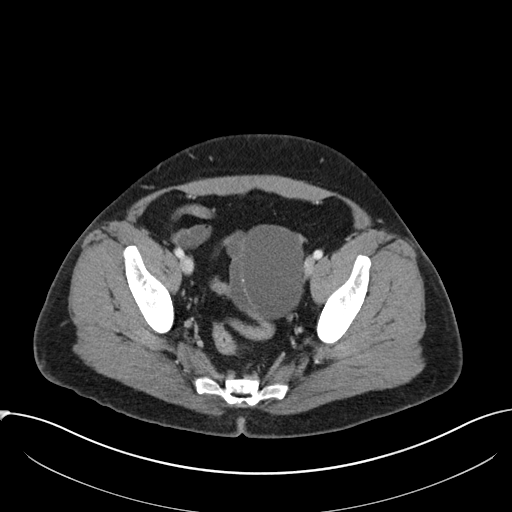
[im 35/100  soft-tissue]
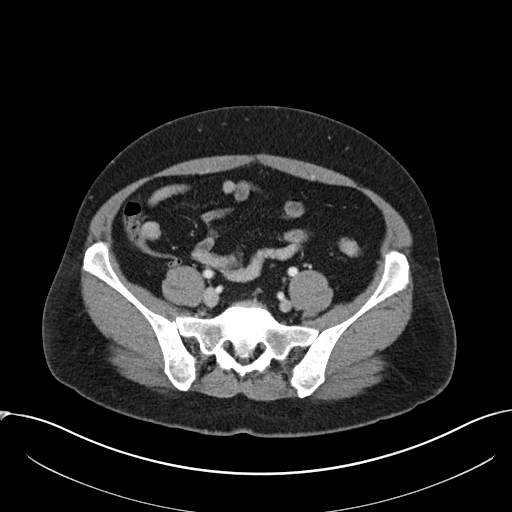
[im 40/100  soft-tissue]
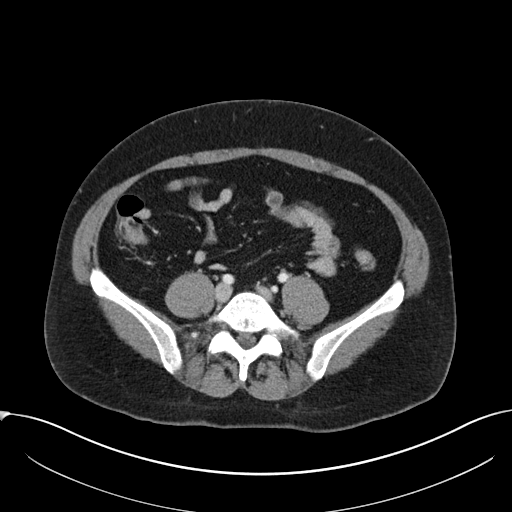
[im 50/100  soft-tissue]
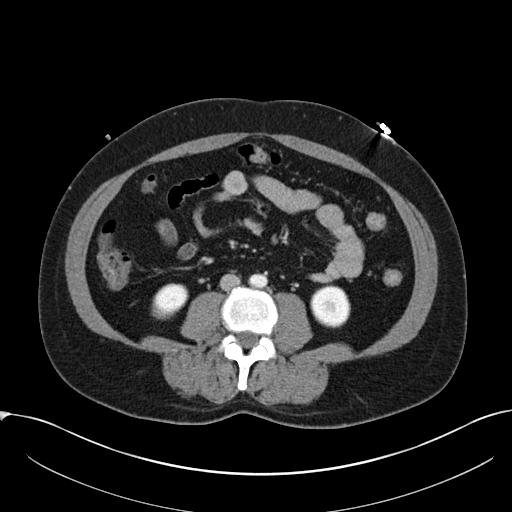
[im 60/100  soft-tissue]
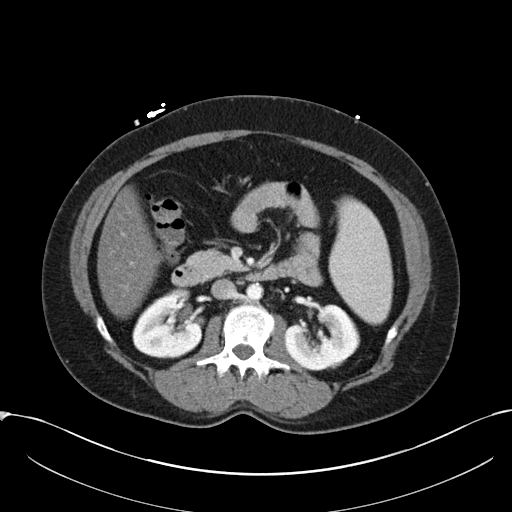
[im 65/100  soft-tissue]
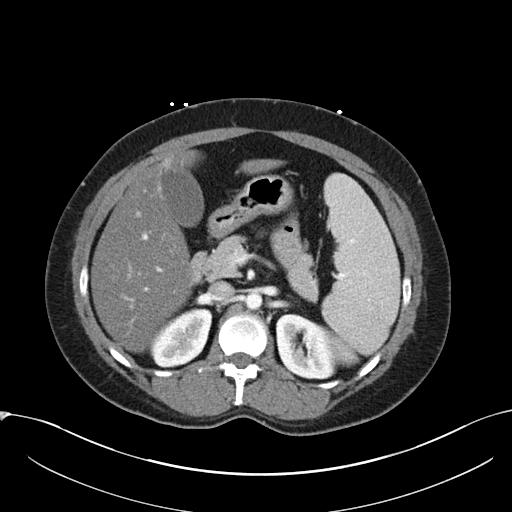
[im 75/100  soft-tissue]
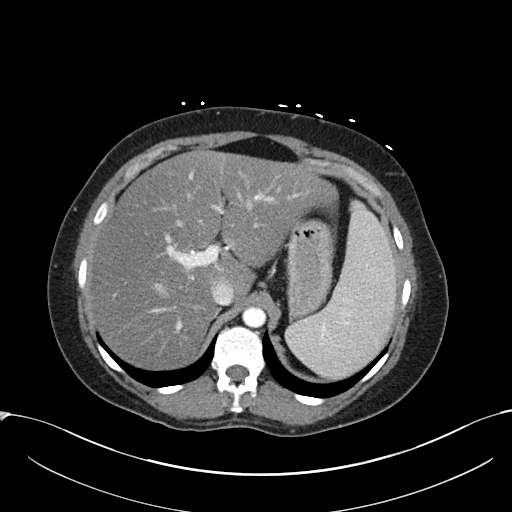
[im 75/100  bone]
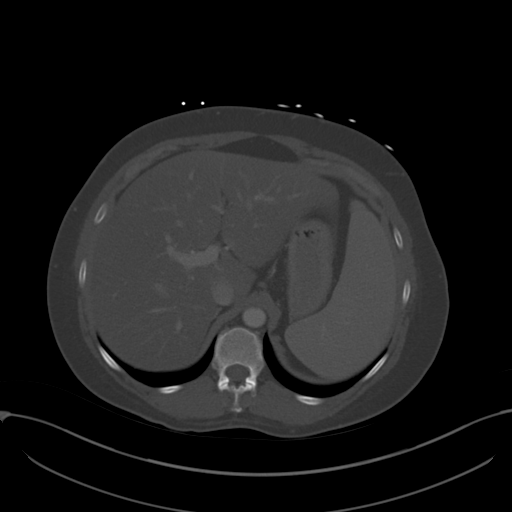
[im 85/100  soft-tissue]
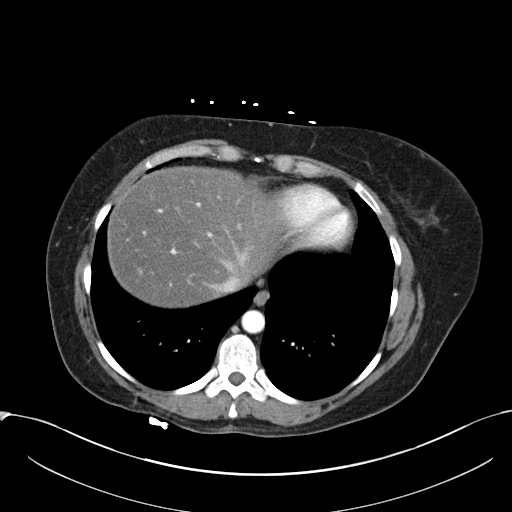
[im 95/100  soft-tissue]
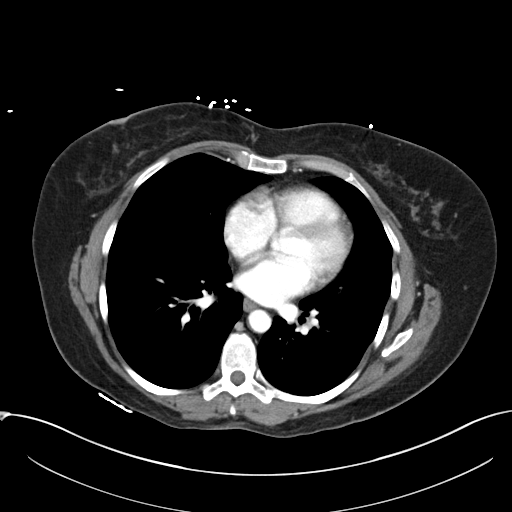

[Series 4: coronals · coronal · 0.71mm/px · 3 of 129 slices shown]
[im 43/129  soft-tissue]
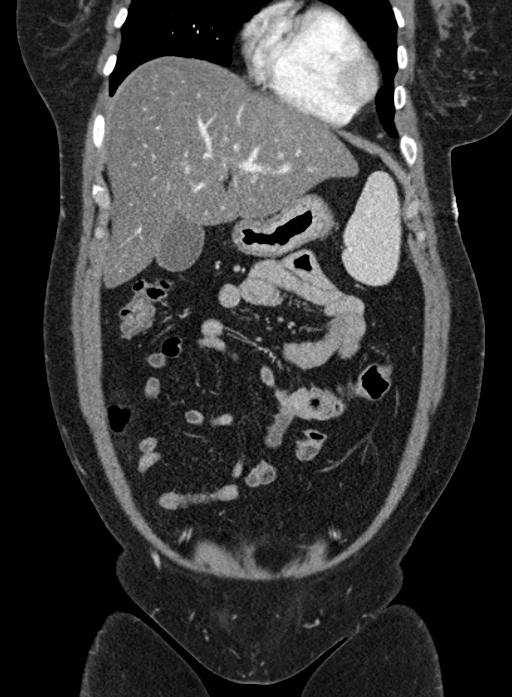
[im 57/129  soft-tissue]
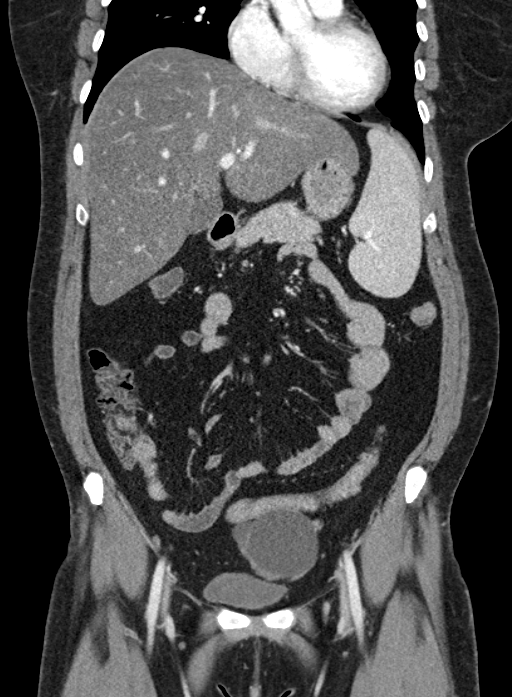
[im 72/129  soft-tissue]
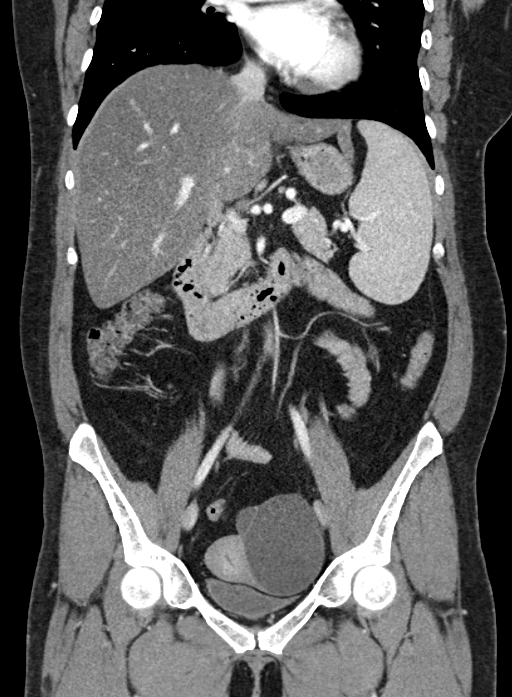

[14 of 46 positions shown; findings below may reference images not displayed]

FINDINGS: Normal hepatic contour. There is diffuse decreased attenuation of
the hepatic parenchyma suggestive of hepatic steatosis. There is a
minimal amount of focal fatty sparing adjacent to the fissure for
ligamentum teres as well as the gallbladder fossa. No discrete
hepatic lesions. Normal appearance of the gallbladder. No definite
radiopaque gallstones. No intra extrahepatic dilatation. No ascites.

There is symmetric enhancement and excretion of the bilateral
kidneys. Subcentimeter right-sided hypoattenuating renal lesions are
too small to accurately characterize though favored to represent
renal cysts. No definite renal stones on this postcontrast
examination. No urinary obstruction or perinephric stranding. Normal
appearance of the bilateral adrenal glands and pancreas. The spleen
is enlarged measuring 15.9 cm in length. No discrete splenic
lesions. No perisplenic stranding.

Scatter minimal colonic diverticulosis without evidence of
diverticulitis. The bowel is otherwise normal in course and caliber
without wall thickening or evidence of obstruction. Normal
appearance of the appendix. No pneumoperitoneum, pneumatosis or
portal venous gas.

Scattered mixed calcified and noncalcified atherosclerotic plaque
with a normal caliber abdominal aorta. The major branch vessels of
the abdominal aorta are patent on this non CTA examination.
Incidental note is made of a circumaortic left renal vein. The
portal vein is widely patent.

Shotty porta hepatis lymph nodes with index porta hepatis nodal
conglomeration measuring approximately 1.4 cm in greatest short axis
diameter (image 30, series 2) presumably reactive in etiology.
Otherwise, no retroperitoneal, mesenteric, pelvic or inguinal
lymphadenopathy.

Note is made of a large, approximately 7.3 x 5.5 cm left-sided
adnexal cyst (coronal image 76, series 4). Additionally, there is an
adjacent smaller approximately 2.5 x 1.5 cm left-sided adnexal cyst
(coronal image 85, series 4). No discrete right-sided adnexal
lesions. There is a trace amount of presumed physiologic fluid
within the endometrial canal. No free fluid within the pelvic
cul-de-sac.

Limited visualization of the lower thorax demonstrates minimal
subsegmental atelectasis within in the image caudal aspect of the
lingula. No focal airspace opacities. No pleural effusion.

Normal heart size.  No pericardial effusion.

No acute or aggressive osseous abnormalities.
IMPRESSION: 1. No explanation for patient's abdominal pain. Specifically, no
evidence of enteric or urinary obstruction. Normal appearance of the
appendix.
2. Hepatic steatosis.  Correlation with LFTs is recommended.
3. Splenomegaly, the etiology of which is not depicted on this
examination.
4. Note is made of two adjacent left-sided presumably physiologic
ovarian cysts, the largest of which measures approximately 7.3 cm in
diameter. Given the size of the dominant cyst, further evaluation
with dedicated pelvic ultrasound in 6 weeks is recommended to ensure
stability and/or resolution.

## 2015-05-17 ENCOUNTER — Encounter: Payer: Self-pay | Admitting: Neurology

## 2015-05-17 ENCOUNTER — Ambulatory Visit (INDEPENDENT_AMBULATORY_CARE_PROVIDER_SITE_OTHER): Payer: BLUE CROSS/BLUE SHIELD | Admitting: Neurology

## 2015-05-17 VITALS — BP 118/72 | HR 80 | Ht 64.0 in | Wt 165.0 lb

## 2015-05-17 DIAGNOSIS — G40309 Generalized idiopathic epilepsy and epileptic syndromes, not intractable, without status epilepticus: Secondary | ICD-10-CM

## 2015-05-17 MED ORDER — LEVETIRACETAM 500 MG PO TABS: ORAL_TABLET | ORAL | Status: DC

## 2015-05-17 NOTE — Patient Instructions (Signed)
1. Continue Keppra 500mg twice a day 2. Follow-up in 6 months, call for any changes  Seizure Precautions: 1. If medication has been prescribed for you to prevent seizures, take it exactly as directed.  Do not stop taking the medicine without talking to your doctor first, even if you have not had a seizure in a long time.   2. Avoid activities in which a seizure would cause danger to yourself or to others.  Don't operate dangerous machinery, swim alone, or climb in high or dangerous places, such as on ladders, roofs, or girders.  Do not drive unless your doctor says you may.  3. If you have any warning that you may have a seizure, lay down in a safe place where you can't hurt yourself.    4.  No driving for 6 months from last seizure, as per [place] state law.   Please refer to the following link on the [employer]ile[employer] of [employer]'s website for more information: http://www.epilepsyfoundation.org/answerplace/Social/driving/drivingu.cfm   5.  Maintain good sleep hygiene. Avoid alcohol.  6.  Notify your neurology if you are planning pregnancy or if you become pregnant.  7.  Contact your doctor if you have any problems that may be related to the medicine you are taking.  8.  Call 911 and bring the patient back to the ED if:        A.  The seizure lasts longer than 5 minutes.       B.  The patient doesn't awaken shortly after the seizure  C.  The patient has new problems such as difficulty seeing, speaking or moving  D.  The patient was injured during the seizure  E.  The patient has a temperature over 102 F (39C)  F.  The patient vomited and now is having trouble breathing         

## 2015-05-17 NOTE — Progress Notes (Signed)
NEUROLOGY FOLLOW UP OFFICE NOTE  BRONTE SABADO [id number]  HISTORY OF PRESENT ILLNESS: I had the pleasure of seeing Bridie Colquhoun in follow-up in the neurology clinic on 05/17/2015.  The patient was last seen 4 months ago for recurrent convulsions that started in May 2016. She would have a presyncopal sensation prior, and one time she was in severe pain then passed out and had a convulsion. Her 1-hour EEG was abnormal with frequent bursts of generalized irregular high voltage 4-5 Hz activity with embedded spike and sharp waves seen in drowsiness and sleep. There was occasional focal slowing seen over the left temporal region. MRI brain normal. She started Keppra 559m BID with no further convulsions since 11/25/14. She denies any headaches, dizziness, diplopia, focal numbness/tingling/weakness, olfactory/gustatory hallucinations. Memory is good. She is not driving. She thinks the seizures are due to [place] would like to reduce dose, however she was having seizures even before Keppra was started.  HPI: This is a pleasant 50yo RH woman with a history of rheumatoid arthritis, with recurrent convulsions since May 2016. [place], she had been standing for a while and was getting ready to sit when she started feeling funny like she would pass out. She felt like she could not catch her breath. She then lost consciousness and was witnessed to make a snorting sound, followed by convulsive activity lasting 30 seconds. This was associated with urinary incontinence. When she came to, she felt confused and could hear her name being called from a distance. She was evaluated by EMS and noted to have stable vital signs and declined going to the ER. The next day she had pink eye and attributed symptoms to that. On 11/25/14, she was with her friend and hit her left knee, recalling she was in severe pain, she bent down to look at her knee and recalls feeling the same sensation  that she could not catch her breath, then passed out and started having a convulsion. Her friend reported she was very rigid and unresponsive with eyes wide open, lasting 30-60 seconds. She was confused and drowsy after, with note of urinary incontinence. She saw her PCP and was given a prescription for prn Diastat. She reports a milder episode around 8 months ago, they were again at the same casino when she started feeling like something was going on, she couldn't catch her breath and breathing felt irregular. She felt like she would pass out, went outside, and started feeling better.   Epilepsy Risk Factors: She had a normal birth and early development. There is no history of febrile convulsions, CNS infections such as meningitis/encephalitis, significant traumatic brain injury, neurosurgical procedures, or family history of seizures. She had been on Enbrel for many years, stopped recently due to the convulsions.   Diagnostic Data:  Her 1-hour EEG was abnormal with frequent bursts of generalized irregular high voltage 4-5 Hz activity with embedded spike and sharp waves seen in drowsiness and sleep. There was occasional focal slowing seen over the left temporal region. MRI brain normal.  Laboratory Data: 05/2014 normal CBC, CMP, ESR, CRP Echocardiogram was normal, EF 55-60%.  PAST MEDICAL HISTORY: Past Medical History  Diagnosis Date  . Ectopic pregnancy   . Kidney stones     "passed them"  . [place]     MEDICATIONS: Current Outpatient Prescriptions on File Prior to Visit  Medication Sig Dispense Refill  . levETIRAcetam (KEPPRA) 500 MG tablet Take 1 tablet  twice a day 60 tablet 6  . diazepam (DIASTAT ACUDIAL) 10 MG GEL Place 10 mg rectally once as needed (sustained seizure). (Patient not taking: Reported on 12/13/2014) 1 Package 1   No current facility-administered medications on file prior to visit.    ALLERGIES: Allergies  Allergen Reactions  . Celebrex [Celecoxib]  Hives  . Sulfur Hives    FAMILY HISTORY: Family History  Problem Relation Age of Onset  . Arthritis Mother   . Hypertension Mother   . Cancer Neg Hx     SOCIAL HISTORY: Social History   Social History  . Marital Status: Married    Spouse Name: N/A  . Number of Children: 1  . Years of Education: N/A   Occupational History  . 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 Smoking status: Never Smoker   . Smokeless tobacco: Never Used  . Alcohol Use: No  . Drug Use: Yes    Special: Marijuana     Comment: 1 jt per day at night  . Sexual Activity: Not Currently   Other Topics Concern  . Not on file   Social History Narrative    REVIEW OF SYSTEMS: Constitutional: No fevers, chills, or sweats, no generalized fatigue, change in appetite Eyes: No visual changes, double vision, eye pain Ear, nose and throat: No hearing loss, ear pain, nasal congestion, sore throat Cardiovascular: No chest pain, palpitations Respiratory:  No shortness of breath at rest or with exertion, wheezes GastrointestinaI: No nausea, vomiting, diarrhea, abdominal pain, fecal incontinence Genitourinary:  No dysuria, urinary retention or frequency Musculoskeletal:  No neck pain, back pain Integumentary: No rash, pruritus, skin lesions Neurological: as above Psychiatric: No depression, insomnia, anxiety Endocrine: No palpitations, fatigue, diaphoresis, mood swings, change in appetite, change in weight, increased thirst Hematologic/Lymphatic:  No anemia, purpura, petechiae. Allergic/Immunologic: no itchy/runny eyes, nasal congestion, recent allergic reactions, rashes  PHYSICAL EXAM: Filed Vitals:   05/17/15 0816  BP: 118/72  Pulse: 80   General: No acute distress Head:  Normocephalic/atraumatic Neck: supple, no paraspinal tenderness, full range of motion Heart:  Regular rate and rhythm Lungs:  Clear to auscultation bilaterally Back: No paraspinal  tenderness Skin/Extremities: No rash, no edema Neurological Exam: alert and oriented to person, place, and time. No aphasia or dysarthria. Fund of knowledge is appropriate.  Recent and remote memory are intact. Attention and concentration are normal.   Able to name objects and repeat phrases. Cranial nerves: Pupils equal, round, reactive to light.  Extraocular movements intact with no nystagmus. Visual fields full. Facial sensation intact. No facial asymmetry. Tongue, uvula, palate midline.  Motor: Bulk and tone normal, muscle strength 5/5 throughout with no pronator drift.  Sensation to light touch intact.  No extinction to double simultaneous stimulation.  Deep tendon reflexes 2+ throughout, toes downgoing.  Finger to nose testing intact.  Gait narrow-based and steady, able to tandem walk adequately.  Romberg negative.  IMPRESSION: This is a pleasant 50 yo RH woman with a history of rheumatoid arthritis, with 2 convulsions since May 2016. The last convulsion was on 11/25/14. She would feel a presyncopal sensation prior. 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 There is occasional focal slowing over the left temporal region, raising the possibility of secondary bisynchrony. MRI brain normal. She continues on Keppra 554m BID with no convulsions since July 2016. She is concerned seizures are due to [place] however I discussed her EEG findings indicating a predisposition for seizures and indication for Keppra  intake. She has no side effects on Keppra. Continue Keppra 520m BID. We again discussed avoidance of seizure triggers, including missing medications, alcohol, and sleep deprivation. [place] driving laws were again discussed with the patient, and she knows to stop driving after an episode of loss of consciousness, until 6 months event-free. She will follow-up in 6 months and knows to call our office for any changes.   Thank you for allowing me to participate in her care.  Please do not  hesitate to call for any questions or concerns.  The duration of this appointment visit was 24 minutes of face-to-face time with the patient.  Greater than 50% of this time was spent in counseling, explanation of diagnosis, planning of further management, and coordination of care.   KEllouise Newer M.D.   CC: Dr. GJuanito Doom

## 2015-07-04 ENCOUNTER — Encounter: Payer: Self-pay | Admitting: Neurology

## 2015-07-26 ENCOUNTER — Encounter: Payer: Self-pay | Admitting: Hematology and Oncology

## 2015-07-26 ENCOUNTER — Telehealth: Payer: Self-pay | Admitting: Hematology and Oncology

## 2015-07-26 NOTE — Telephone Encounter (Signed)
new patient appt-patient scheduled for 04/06 @ 11:30 w/Dr. Alvy Bimler, welcome packet mailed.  Referring Dr. Leigh Aurora.  New patient letter faxed to referral office informing of np appt [phone]

## 2015-08-06 ENCOUNTER — Encounter (HOSPITAL_COMMUNITY): Payer: Self-pay

## 2015-08-06 ENCOUNTER — Emergency Department (HOSPITAL_COMMUNITY): Payer: BLUE CROSS/BLUE SHIELD

## 2015-08-06 ENCOUNTER — Emergency Department (HOSPITAL_COMMUNITY)
Admission: EM | Admit: 2015-08-06 | Discharge: 2015-08-06 | Disposition: A | Discharge status: Home / Self Care | Payer: BLUE CROSS/BLUE SHIELD | Attending: Emergency Medicine | Admitting: Emergency Medicine

## 2015-08-06 DIAGNOSIS — E860 Dehydration: Secondary | ICD-10-CM | POA: Insufficient documentation

## 2015-08-06 DIAGNOSIS — M069 Rheumatoid arthritis, unspecified: Secondary | ICD-10-CM | POA: Insufficient documentation

## 2015-08-06 DIAGNOSIS — R112 Nausea with vomiting, unspecified: Secondary | ICD-10-CM | POA: Diagnosis not present

## 2015-08-06 DIAGNOSIS — Z79899 Other long term (current) drug therapy: Secondary | ICD-10-CM | POA: Insufficient documentation

## 2015-08-06 DIAGNOSIS — Z87442 Personal history of urinary calculi: Secondary | ICD-10-CM | POA: Diagnosis not present

## 2015-08-06 DIAGNOSIS — R1110 Vomiting, unspecified: Secondary | ICD-10-CM | POA: Insufficient documentation

## 2015-08-06 LAB — COMPREHENSIVE METABOLIC PANEL
ALBUMIN: 4.2 g/dL (ref 3.5–5.0)
ALK PHOS: 71 U/L (ref 38–126)
ALT: 108 U/L — ABNORMAL HIGH (ref 14–54)
ANION GAP: 13 (ref 5–15)
AST: 94 U/L — ABNORMAL HIGH (ref 15–41)
BUN: 7 mg/dL (ref 6–20)
CHLORIDE: 105 mmol/L (ref 101–111)
CO2: 20 mmol/L — ABNORMAL LOW (ref 22–32)
Calcium: 9.1 mg/dL (ref 8.9–10.3)
Creatinine, Ser: 0.69 mg/dL (ref 0.44–1.00)
GFR calc non Af Amer: >60 mL/min (ref >60)
Glucose, Bld: 119 mg/dL — ABNORMAL HIGH (ref 65–99)
POTASSIUM: 5.6 mmol/L — AB (ref 3.5–5.1)
SODIUM: 138 mmol/L (ref 135–145)
TOTAL PROTEIN: 7.1 g/dL (ref 6.5–8.1)
Total Bilirubin: 2.7 mg/dL — ABNORMAL HIGH (ref 0.3–1.2)

## 2015-08-06 LAB — CBC WITH DIFFERENTIAL/PLATELET
BASOS ABS: 0 10*3/uL (ref 0.0–0.1)
BASOS PCT: 0 %
EOS ABS: 0.1 10*3/uL (ref 0.0–0.7)
Eosinophils Relative: 1 %
HEMATOCRIT: 39.3 % (ref 36.0–46.0)
Hemoglobin: 14 g/dL (ref 12.0–15.0)
Lymphocytes Relative: 28 %
Lymphs Abs: 1.7 10*3/uL (ref 0.7–4.0)
MCH: 32.4 pg (ref 26.0–34.0)
MCHC: 35.6 g/dL (ref 30.0–36.0)
MCV: 91 fL (ref 78.0–100.0)
MONO ABS: 0.3 10*3/uL (ref 0.1–1.0)
Monocytes Relative: 4 %
NEUTROS ABS: 4.2 10*3/uL (ref 1.7–7.7)
NEUTROS PCT: 67 %
Platelets: 105 10*3/uL — ABNORMAL LOW (ref 150–400)
RBC: 4.32 MIL/uL (ref 3.87–5.11)
RDW: 12 % (ref 11.5–15.5)
WBC: 6.3 10*3/uL (ref 4.0–10.5)

## 2015-08-06 LAB — LIPASE, BLOOD: LIPASE: 33 U/L (ref 11–51)

## 2015-08-06 MED ORDER — SODIUM CHLORIDE 0.9 % IV SOLN
1000.0000 mL | INTRAVENOUS | Status: DC
  Administered 2015-08-06: 1000 mL via INTRAVENOUS

## 2015-08-06 MED ORDER — MORPHINE SULFATE (PF) 4 MG/ML IV SOLN
4.0000 mg | Freq: Once | INTRAVENOUS | Status: AC
  Administered 2015-08-06: 4 mg via INTRAVENOUS
  Filled 2015-08-06: qty 1

## 2015-08-06 MED ORDER — ONDANSETRON 8 MG PO TBDP: 8.0000 mg | ORAL_TABLET | Freq: Three times a day (TID) | ORAL | Status: DC | PRN

## 2015-08-06 MED ORDER — SODIUM CHLORIDE 0.9 % IV SOLN
1000.0000 mL | Freq: Once | INTRAVENOUS | Status: AC
  Administered 2015-08-06: 1000 mL via INTRAVENOUS

## 2015-08-06 MED ORDER — ONDANSETRON HCL 4 MG/2ML IJ SOLN
4.0000 mg | Freq: Once | INTRAMUSCULAR | Status: AC
  Administered 2015-08-06: 4 mg via INTRAVENOUS
  Filled 2015-08-06: qty 2

## 2015-08-06 NOTE — ED Provider Notes (Signed)
CSN: [id number]     Arrival date & time 08/06/15  [id number] History   First MD Initiated Contact with Patient 08/06/15 [phone]     Chief Complaint  Patient presents with  . Emesis     HPI Patient presents complaining of nausea and vomiting over the past 24 hours.  She was recently started on antibiotics and is unclear if this was the reason for her vomiting.  She denies diarrhea.  She has had abdominal surgery but reports a prior history of small bowel obstructions.  She has a history kidney stones.  Reports no flank pain.  She denies dysuria or urinary frequency.  No fevers or chills.  No recent sick contacts.  She's had inability keep fluids down because of the nausea and vomiting and feels slightly weak at this time.  No chest pain shortness breath.  No productive cough.  Symptoms are moderate in severity   Past Medical History  Diagnosis Date  . Ectopic pregnancy   . Kidney stones     "passed them"  . Rheumatoid arthritis [facility])    Past Surgical History  Procedure Laterality Date  . Ectopic pregnancy surgery  1990's  . Hip surgery Bilateral 2000's    "had fluid in them"  . Salpingoophorectomy  1990's    "related to ectopic pregnancy"  . Laparoscopic salpingo oopherectomy Left 12/30/2013    Procedure: LEFT SALPINGOOPHORECTOMY;  Surgeon: Lavonia Drafts, MD;  Location: [facility];  Service: Gynecology;  Laterality: Left;  BILATERAL SALPINGECTOMY  . Laparoscopic salpingo oopherectomy Right 2000   Family History  Problem Relation Age of Onset  . Arthritis Mother   . Hypertension Mother   . Cancer Neg Hx    Social History  Substance Use Topics  . Smoking status: Never Smoker   . Smokeless tobacco: Never Used  . Alcohol Use: No   OB History    Gravida Para Term Preterm AB TAB SAB Ectopic Multiple Living   3 1 1  0 2 1 0 1 0 1     Review of Systems  All other systems reviewed and are negative.     Allergies  Celebrex and Sulfur  Home Medications   Prior to Admission  medications   Medication Sig Start Date End Date Taking? Authorizing Provider  diazepam (DIASTAT ACUDIAL) 10 MG GEL Place 10 mg rectally once as needed (sustained seizure). Patient not taking: Reported on 12/13/2014 11/25/14   Patrecia Pour, MD  levETIRAcetam (KEPPRA) 500 MG tablet Take 1 tablet twice a day 05/17/15   Cameron Sprang, MD  [facility] CLICKJECT 0000000 MG/ML SOAJ Once a week 04/27/15   Historical Provider, MD  predniSONE (DELTASONE) 5 MG tablet Take 5 mg by mouth. 04/18/15   Historical Provider, MD   BP 162/87 mmHg  Pulse 63  Temp(Src) 97.6 F (36.4 C) (Oral)  Resp 22  Ht 5\' 4"  (1.626 m)  Wt 159 lb (72.122 kg)  BMI 27.28 kg/m2  SpO2 100% Physical Exam  Constitutional: She is oriented to person, place, and time. She appears well-developed and well-nourished. No distress.  HENT:  Head: Normocephalic and atraumatic.  Eyes: EOM are normal.  Neck: Normal range of motion.  Cardiovascular: Normal rate, regular rhythm and normal heart sounds.   Pulmonary/Chest: Effort normal and breath sounds normal.  Abdominal: Soft. She exhibits no distension. There is no tenderness.  Musculoskeletal: Normal range of motion.  Neurological: She is alert and oriented to person, place, and time.  Skin: Skin is warm and dry.  Psychiatric: She has a normal mood and affect. Judgment normal.  Nursing note and vitals reviewed.   ED Course  Procedures (including critical care time) Labs Review Labs Reviewed  COMPREHENSIVE METABOLIC PANEL - Abnormal; Notable for the following:    Potassium 5.6 (*)    CO2 20 (*)    Glucose, Bld 119 (*)    AST 94 (*)    ALT 108 (*)    Total Bilirubin 2.7 (*)    All other components within normal limits  CBC WITH DIFFERENTIAL/PLATELET - Abnormal; Notable for the following:    Platelets 105 (*)    All other components within normal limits  LIPASE, BLOOD  CBC   ALT  Date Value Ref Range Status  08/06/2015 108* 14 - 54 U/L Final  09/08/2013 41* 0 - 35 U/L Final   07/11/2013 39* 0 - 35 U/L Final  07/09/2013 59* 0 - 35 U/L Final    AST  Date Value Ref Range Status  08/06/2015 94* 15 - 41 U/L Final  09/08/2013 25 0 - 37 U/L Final  07/11/2013 31 0 - 37 U/L Final  07/09/2013 46* 0 - 37 U/L Final      Imaging Review Dg Abd 2 Views  08/06/2015  CLINICAL DATA:  Per patient she has been nausea, dry heaves, vomiting X 2 days. HX kidney stones EXAM: ABDOMEN - 2 VIEW COMPARISON:  None. FINDINGS: No dilated loops of large or small bowel. Gas and stool in the rectum. No pathologic calcifications. No organomegaly. No acute osseous abnormality IMPRESSION: No acute abdominal findings Electronically Signed   By: Suzy Bouchard M.D.   On: 08/06/2015 10:25   I have personally reviewed and evaluated these images and lab results as part of my medical decision-making.   EKG Interpretation None      MDM   Final diagnoses:  Vomiting  Dehydration    3:10 PM Patient feels much better at this time.  Discharge home in good condition.  Home with antinausea medication.  Mild nonspecific elevation in her LFTs and bili.  She has no abdominal tenderness on examination at this time.  Primary care follow-up.  She understands to return to the ER for new or worsening symptoms.    Jola Schmidt, MD 08/06/15 [phone]

## 2015-08-06 NOTE — ED Notes (Signed)
Dr. Campos at bedside   

## 2015-08-06 NOTE — ED Notes (Signed)
Pt. Began having vomiting last night.  Denies any loose stools.  She started on Clindamycin and Hydrocodone. She took a dose last night and started vomiting.  She is not sure if it is from the medications or if she has a flu.

## 2015-08-06 NOTE — ED Notes (Signed)
Pt given crackers and water for PO challenge

## 2015-08-06 NOTE — ED Notes (Signed)
Contacted lab regarding delay in CBC, states sample hemolyzed and they need a repeat. Phlebotomy notified of need for re-draw.

## 2015-08-18 ENCOUNTER — Ambulatory Visit (HOSPITAL_BASED_OUTPATIENT_CLINIC_OR_DEPARTMENT_OTHER): Payer: BLUE CROSS/BLUE SHIELD

## 2015-08-18 ENCOUNTER — Ambulatory Visit (HOSPITAL_BASED_OUTPATIENT_CLINIC_OR_DEPARTMENT_OTHER): Payer: BLUE CROSS/BLUE SHIELD | Admitting: Hematology and Oncology

## 2015-08-18 ENCOUNTER — Telehealth: Payer: Self-pay | Admitting: Hematology and Oncology

## 2015-08-18 ENCOUNTER — Encounter: Payer: Self-pay | Admitting: Hematology and Oncology

## 2015-08-18 VITALS — BP 121/83 | HR 87 | Temp 98.3°F | Resp 18 | Wt 160.4 lb

## 2015-08-18 DIAGNOSIS — R161 Splenomegaly, not elsewhere classified: Secondary | ICD-10-CM

## 2015-08-18 DIAGNOSIS — D472 Monoclonal gammopathy: Secondary | ICD-10-CM | POA: Insufficient documentation

## 2015-08-18 DIAGNOSIS — E538 Deficiency of other specified B group vitamins: Secondary | ICD-10-CM

## 2015-08-18 DIAGNOSIS — M069 Rheumatoid arthritis, unspecified: Secondary | ICD-10-CM | POA: Diagnosis not present

## 2015-08-18 DIAGNOSIS — R748 Abnormal levels of other serum enzymes: Secondary | ICD-10-CM | POA: Diagnosis not present

## 2015-08-18 DIAGNOSIS — D696 Thrombocytopenia, unspecified: Secondary | ICD-10-CM

## 2015-08-18 HISTORY — DX: Deficiency of other specified B group vitamins: E53.8

## 2015-08-18 HISTORY — DX: Splenomegaly, not elsewhere classified: R16.1

## 2015-08-18 HISTORY — DX: Monoclonal gammopathy: D47.2

## 2015-08-18 LAB — CBC WITH DIFFERENTIAL/PLATELET
BASO%: 0.8 % (ref 0.0–2.0)
Basophils Absolute: 0 10*3/uL (ref 0.0–0.1)
EOS ABS: 0.1 10*3/uL (ref 0.0–0.5)
EOS%: 2.2 % (ref 0.0–7.0)
HCT: 41.9 % (ref 34.8–46.6)
HEMOGLOBIN: 14.5 g/dL (ref 11.6–15.9)
LYMPH%: 40.7 % (ref 14.0–49.7)
MCH: 32.1 pg (ref 25.1–34.0)
MCHC: 34.6 g/dL (ref 31.5–36.0)
MCV: 92.8 fL (ref 79.5–101.0)
MONO#: 0.4 10*3/uL (ref 0.1–0.9)
MONO%: 6.8 % (ref 0.0–14.0)
NEUT%: 49.5 % (ref 38.4–76.8)
NEUTROS ABS: 2.9 10*3/uL (ref 1.5–6.5)
Platelets: 125 10*3/uL — ABNORMAL LOW (ref 145–400)
RBC: 4.51 10*6/uL (ref 3.70–5.45)
RDW: 12 % (ref 11.2–14.5)
WBC: 5.8 10*3/uL (ref 3.9–10.3)
lymph#: 2.4 10*3/uL (ref 0.9–3.3)

## 2015-08-18 LAB — COMPREHENSIVE METABOLIC PANEL
ALBUMIN: 4 g/dL (ref 3.5–5.0)
ALK PHOS: 70 U/L (ref 40–150)
ALT: 117 U/L — ABNORMAL HIGH (ref 0–55)
ANION GAP: 7 meq/L (ref 3–11)
AST: 66 U/L — ABNORMAL HIGH (ref 5–34)
BILIRUBIN TOTAL: 1 mg/dL (ref 0.20–1.20)
BUN: 12.4 mg/dL (ref 7.0–26.0)
CO2: 26 mEq/L (ref 22–29)
Calcium: 9.1 mg/dL (ref 8.4–10.4)
Chloride: 111 mEq/L — ABNORMAL HIGH (ref 98–109)
Creatinine: 0.9 mg/dL (ref 0.6–1.1)
EGFR: 77 mL/min/{1.73_m2} — AB (ref >90)
Glucose: 107 mg/dl (ref 70–140)
Potassium: 3.5 mEq/L (ref 3.5–5.1)
Sodium: 143 mEq/L (ref 136–145)
TOTAL PROTEIN: 7.7 g/dL (ref 6.4–8.3)

## 2015-08-18 NOTE — Assessment & Plan Note (Signed)
The cause the cause is unknown. Her liver enzymes are quite elevated. I will order additional workup including hepatitis panel and CT scan

## 2015-08-18 NOTE — Assessment & Plan Note (Signed)
The cause is unknown, could be related to autoimmune disorder, liver disease and splenomegaly. She is not symptomatic. Observe for now. I will order additional workup

## 2015-08-18 NOTE — Assessment & Plan Note (Signed)
She is undergoing evaluation and treatment by rheumatologists. Sometimes, rheumatoid arthritis can cause autoimmune thrombocytopenia and splenomegaly

## 2015-08-18 NOTE — Assessment & Plan Note (Signed)
This could be secondary to rheumatoid arthritis or liver disease. I will order additional workup including CT scan

## 2015-08-18 NOTE — Progress Notes (Signed)
[place] CONSULT NOTE  Patient Care Team: [name], MD as PCP - General  CHIEF COMPLAINTS/PURPOSE OF CONSULTATION:  Abnormal M spike, on background history of rheumatoid arthritis and abnormal liver enzymes with splenomegaly  HISTORY OF PRESENTING ILLNESS:  [name] 50 y.o. female is here because of abnormal blood work. She has rheumatoid arthritis and is undergoing chronic treatment. She was treated with methotrexate in the past, over 15 years ago. She has been on intermittent doses of prednisone and prior treatment with Enbrel. Most recently, she was started on Orencia. Her rheumatologist order SPEP which came back abnormal and hence she is referred here. The patient was under the impression she is being referred here because of abnormalities in the liver. On further review, she was noted to have abnormal liver function tests. She had imaging study done in her abdomen many years ago which show evidence of splenomegaly. She is not aware of prior diagnosis of hepatitis. On further review of her blood work, she is also noted to have thrombocytopenia.The patient denies any recent signs or symptoms of bleeding such as spontaneous epistaxis, hematuria or hematochezia. She has never received transfusions before.  She denies history of abnormal bone pain or bone fracture. Patient denies history of recurrent infection or atypical infections such as shingles of meningitis. Denies chills, night sweats, anorexia or abnormal weight loss. She had history of seizure disorder but has not taken any medications recently. Her last seizure was in July 2016   MEDICAL HISTORY:  Past Medical History  Diagnosis Date  . Ectopic pregnancy   . Kidney stones     "passed them"  . Rheumatoid arthritis ([place])   . Elevated liver enzymes 08/18/2015  . MGUS (monoclonal gammopathy of unknown significance) 08/18/2015  . Splenomegaly 08/18/2015  . Vitamin B12 deficiency 08/18/2015     SURGICAL HISTORY: Past Surgical History  Procedure Laterality Date  . Ectopic pregnancy surgery  1990's  . Hip surgery Bilateral 2000's    "had fluid in them"  . Salpingoophorectomy  1990's    "related to ectopic pregnancy"  . Laparoscopic salpingo oopherectomy Left 12/30/2013    Procedure: LEFT SALPINGOOPHORECTOMY;  Surgeon: Lavonia Drafts, MD;  Location: [facility];  Service: Gynecology;  Laterality: Left;  BILATERAL SALPINGECTOMY  . Laparoscopic salpingo oopherectomy Right 2000    SOCIAL HISTORY: Social History   Social History  . Marital Status: Married    Spouse Name: N/A  . Number of Children: 1  . Years of Education: N/A   Occupational History  . 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 Smoking status: Current Every Day Smoker -- 0.25 packs/day for 25 years  . Smokeless tobacco: Never Used  . Alcohol Use: No  . Drug Use: Yes    Special: Marijuana     Comment: 1 jt per day at night  . Sexual Activity: Not Currently   Other Topics Concern  . Not on file   Social History Narrative    FAMILY HISTORY: Family History  Problem Relation Age of Onset  . Arthritis Mother   . Hypertension Mother   . Cancer Maternal Uncle     lung ca  . Cancer Maternal Grandmother     nose & brain cancer    ALLERGIES:  is allergic to celebrex and sulfur.  MEDICATIONS:  Current Outpatient Prescriptions  Medication Sig Dispense Refill  . clindamycin (CLEOCIN) 150 MG capsule Take 150 mg by mouth 4 (four) times daily.    Marland Kitchen  diazepam (DIASTAT ACUDIAL) 10 MG GEL Place 10 mg rectally once as needed (sustained seizure). 1 Package 1  . HYDROcodone-acetaminophen (NORCO) 7.5-325 MG tablet Take 1 tablet by mouth every 6 (six) hours as needed for moderate pain.    Marland Kitchen levETIRAcetam (KEPPRA) 500 MG tablet Take 1 tablet twice a day 60 tablet 11  . ondansetron (ZOFRAN ODT) 8 MG disintegrating tablet Take 1 tablet (8 mg total) by mouth every 8 (eight) hours as  needed for nausea or vomiting. 12 tablet 0  . ORENCIA CLICKJECT 283 MG/ML SOAJ Inject 1 vial into the skin every 7 (seven) days. Once a week  1  . predniSONE (DELTASONE) 5 MG tablet Take 5 mg by mouth.  0   No current facility-administered medications for this visit.    REVIEW OF SYSTEMS:   Eyes: Denies blurriness of vision, double vision or watery eyes Ears, nose, mouth, throat, and face: Denies mucositis or sore throat Respiratory: Denies cough, dyspnea or wheezes Cardiovascular: Denies palpitation, chest discomfort or lower extremity swelling Gastrointestinal:  Denies nausea, heartburn or change in bowel habits Skin: Denies abnormal skin rashes Lymphatics: Denies new lymphadenopathy or easy bruising Neurological:Denies numbness, tingling or new weaknesses Behavioral/Psych: Mood is stable, no new changes  All other systems were reviewed with the patient and are negative.  PHYSICAL EXAMINATION: ECOG PERFORMANCE STATUS: 0 - Asymptomatic  Filed Vitals:   08/18/15 1107  BP: 121/83  Pulse: 87  Temp: 98.3 F (36.8 C)  Resp: 18   Filed Weights   08/18/15 1107  Weight: 160 lb 6.4 oz (72.757 kg)    GENERAL:alert, no distress and comfortable SKIN: skin color, texture, turgor are normal, no rashes or significant lesions EYES: normal, conjunctiva are pink and non-injected, sclera clear OROPHARYNX:no exudate, no erythema and lips, buccal mucosa, and tongue normal  NECK: supple, thyroid normal size, non-tender, without nodularity LYMPH:  no palpable lymphadenopathy in the cervical, axillary or inguinal LUNGS: clear to auscultation and percussion with normal breathing effort HEART: regular rate & rhythm and no murmurs and no lower extremity edema ABDOMEN:abdomen soft, non-tender and normal bowel sounds. Palpable splenomegaly Musculoskeletal:no cyanosis of digits and no clubbing  PSYCH: alert & oriented x 3 with fluent speech NEURO: no focal motor/sensory deficits  LABORATORY DATA:   I have reviewed the data as listed Lab Results  Component Value Date   WBC 5.8 08/18/2015   HGB 14.5 08/18/2015   HCT 41.9 08/18/2015   MCV 92.8 08/18/2015   PLT 125* 08/18/2015    RADIOGRAPHIC STUDIES: I have personally reviewed the radiological images as listed and agreed with the findings in the report. Dg Abd 2 Views  08/06/2015  CLINICAL DATA:  Per patient she has been nausea, dry heaves, vomiting X 2 days. HX kidney stones EXAM: ABDOMEN - 2 VIEW COMPARISON:  None. FINDINGS: No dilated loops of large or small bowel. Gas and stool in the rectum. No pathologic calcifications. No organomegaly. No acute osseous abnormality IMPRESSION: No acute abdominal findings Electronically Signed   By: Suzy Bouchard M.D.   On: 08/06/2015 10:25    ASSESSMENT & PLAN:  MGUS (monoclonal gammopathy of unknown significance) I will order additional workup to confirm. Clinically, she does not appear to have any evidence of end organ damage. I will see her back next week to review test results  Thrombocytopenia, unspecified ([place]) The cause is unknown, could be related to autoimmune disorder, liver disease and splenomegaly. She is not symptomatic. Observe for now. I will order additional workup  Rheumatoid arthritis ([place]) She is undergoing evaluation and treatment by rheumatologists. Sometimes, rheumatoid arthritis can cause autoimmune thrombocytopenia and splenomegaly  Elevated liver enzymes The cause the cause is unknown. Her liver enzymes are quite elevated. I will order additional workup including hepatitis panel and CT scan  Splenomegaly This could be secondary to rheumatoid arthritis or liver disease. I will order additional workup including CT scan     Orders Placed This Encounter  Procedures  . CT Abdomen Pelvis W Contrast    Standing Status: Future     Number of Occurrences:      Standing Expiration Date: 11/17/2016    Order Specific Question:  Reason for Exam (SYMPTOM  OR DIAGNOSIS  REQUIRED)    Answer:  elevated liver enzymes, splenomegaly, rheumatoid arthirtis    Order Specific Question:  Is the patient pregnant?    Answer:  No    Order Specific Question:  Preferred imaging location?    Answer:  [facility]  . CBC with Differential/Platelet    Standing Status: Future     Number of Occurrences: 1     Standing Expiration Date: 09/21/2016  . Comprehensive metabolic panel    Standing Status: Future     Number of Occurrences: 1     Standing Expiration Date: 09/21/2016  . Hepatitis panel, acute    Standing Status: Future     Number of Occurrences: 1     Standing Expiration Date: 09/21/2016  . Multiple Myeloma Panel (SPEP&IFE w/QIG)    Standing Status: Future     Number of Occurrences: 1     Standing Expiration Date: 09/21/2016  . Kappa/lambda light chains    Standing Status: Future     Number of Occurrences: 1     Standing Expiration Date: 09/21/2016  . Vitamin B12    Standing Status: Future     Number of Occurrences: 1     Standing Expiration Date: 09/21/2016    All questions were answered. The patient knows to call the clinic with any problems, questions or concerns. I spent 40 minutes counseling the patient face to face. The total time spent in the appointment was 55 minutes and more than 50% was on counseling.     [facility], [place], MD 08/18/2015 3:28 PM

## 2015-08-18 NOTE — Telephone Encounter (Signed)
per pfo to sch pt appt-sent pt vack to lab-adv pt that [facility] will call to sch scan-gave contrast

## 2015-08-18 NOTE — Assessment & Plan Note (Signed)
I will order additional workup to confirm. Clinically, she does not appear to have any evidence of end organ damage. I will see her back next week to review test results

## 2015-08-19 LAB — KAPPA/LAMBDA LIGHT CHAINS
Ig Kappa Free Light Chain: 27.7 mg/L — ABNORMAL HIGH (ref 3.30–19.40)
Ig Lambda Free Light Chain: 193.74 mg/L — ABNORMAL HIGH (ref 5.71–26.30)
Kappa/Lambda FluidC Ratio: 0.14 — ABNORMAL LOW (ref 0.26–1.65)

## 2015-08-19 LAB — VITAMIN B12: Vitamin B12: 447 pg/mL (ref 211–946)

## 2015-08-19 LAB — HEPATITIS PANEL, ACUTE
HBsAg Screen: NEGATIVE
HEP A IGM: NEGATIVE
HEP B C IGM: NEGATIVE

## 2015-08-22 ENCOUNTER — Telehealth: Payer: Self-pay | Admitting: *Deleted

## 2015-08-22 LAB — MULTIPLE MYELOMA PANEL, SERUM
ALBUMIN/GLOB SERPL: 1.2 (ref 0.7–1.7)
Albumin SerPl Elph-Mcnc: 3.9 g/dL (ref 2.9–4.4)
Alpha 1: 0.1 g/dL (ref 0.0–0.4)
Alpha2 Glob SerPl Elph-Mcnc: 0.5 g/dL (ref 0.4–1.0)
B-Globulin SerPl Elph-Mcnc: 1.5 g/dL — ABNORMAL HIGH (ref 0.7–1.3)
GAMMA GLOB SERPL ELPH-MCNC: 1.2 g/dL (ref 0.4–1.8)
GLOBULIN, TOTAL: 3.3 g/dL (ref 2.2–3.9)
IGA/IMMUNOGLOBULIN A, SERUM: 674 mg/dL — AB (ref 87–352)
IgG, Qn, Serum: 1134 mg/dL (ref 700–1600)
IgM, Qn, Serum: 119 mg/dL (ref 26–217)
M Protein SerPl Elph-Mcnc: 0.3 g/dL — ABNORMAL HIGH
Total Protein: 7.2 g/dL (ref 6.0–8.5)

## 2015-08-22 NOTE — Telephone Encounter (Signed)
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 States there is not any way she can take the oral contrast for the CT scan, wants to know if there is anything else she can do.

## 2015-08-23 ENCOUNTER — Telehealth: Payer: Self-pay | Admitting: Hematology and Oncology

## 2015-08-23 ENCOUNTER — Telehealth: Payer: Self-pay | Admitting: *Deleted

## 2015-08-23 ENCOUNTER — Other Ambulatory Visit: Payer: Self-pay | Admitting: Hematology and Oncology

## 2015-08-23 MED ORDER — ONDANSETRON HCL 8 MG PO TABS: 8.0000 mg | ORAL_TABLET | Freq: Three times a day (TID) | ORAL | Status: DC | PRN

## 2015-08-23 NOTE — Telephone Encounter (Signed)
TC from patient this morning, again stating that she will not be able to drink the contrast for upcoming scan. She states she has a 'phobia' about drinking 'strange fluids'. She is asking if there is another option. Please advise.

## 2015-08-23 NOTE — Telephone Encounter (Signed)
I placed new POF to move her appt

## 2015-08-23 NOTE — Telephone Encounter (Signed)
LVM for pt informing her of CT r/s to 4/27 at 9 am.  NPO after midnight,  Arrive at 8:45 am.  Drink one bottle contrast at 7 am and one at 8 am.  If she wants to try the [place] then arrive at 7 am.  Gave her number to Radiology Scheduler to r/s if needed.   Informed her of Zofran rx to [facility].  Informed her to expect call from Scheduling for new date/time Dr. Calton Dach appt..  Asked her to let us know before her CT if she still feels like she can't drink any of the contrast.  Encouraged her to talk to someone about this phobia and to look up information about the Barium and the new Lanna Poche online so it might not seem so "strange" and she becomes more familiar.   Asked her to call nurse back if we can help.

## 2015-08-23 NOTE — Telephone Encounter (Signed)
lvm for pt regarding to 4.14 appt cx and moved to 5.1 after ct scan

## 2015-08-23 NOTE — Telephone Encounter (Signed)
She needs to try to drink. Will miss 50% info without contrast. Even if she can drink a quarter of it would help

## 2015-08-23 NOTE — Telephone Encounter (Signed)
Pt called yesterday states has phobia about drinking certain types of liquids and that includes the Barium contrast she was given to drink for CT this Thursday.   Called Radiology Tech and they offered another type of oral contrast called Lanna Poche which some patients prefer.  Pt will need to arrive 2 hours before Ct to drink the [place].  They do not allow pt's to take it home.Marland Kitchen  LVM for pt asking her to call nurse back so we can discuss her phobia and help find a solution.   Informed of of option of trying Breeza.   Also informed her that the more contrast she drinks the better quality of CT results.

## 2015-08-23 NOTE — Telephone Encounter (Signed)
Pt called back, reports feeling "sick all week" with nausea.  Does not think she will be able to drink any type of contrast this week.   She wants to r/s her CT scan and MD visit to next week when she hopes she will be feeling better.

## 2015-08-23 NOTE — Telephone Encounter (Signed)
Pls call CT to cancel and reschedule to 2 weeks Offer her zofran 8 mg TID/prn nausea, dispense 30 no refills Once you get the date/time for CT let me know and I will put new POF for return appt

## 2015-08-25 ENCOUNTER — Ambulatory Visit (HOSPITAL_COMMUNITY): Payer: BLUE CROSS/BLUE SHIELD

## 2015-08-26 ENCOUNTER — Ambulatory Visit: Payer: BLUE CROSS/BLUE SHIELD | Admitting: Hematology and Oncology

## 2015-09-02 IMAGING — US US TRANSVAGINAL NON-OB
1 series · 14 of 25 positions shown · non-contrast
Comparison: Pelvic ultrasound 09/23/2013

CLINICAL DATA: f/u adnexal mass

EXAM:
TRANSVAGINAL ULTRASOUND OF PELVIS
TECHNIQUE: Transvaginal ultrasound examination of the pelvis was performed
including evaluation of the uterus, ovaries, adnexal regions, and
pelvic cul-de-sac.

[Series 1: us transvaginal non-ob · 14 of 39 slices shown]
[im 1/39]
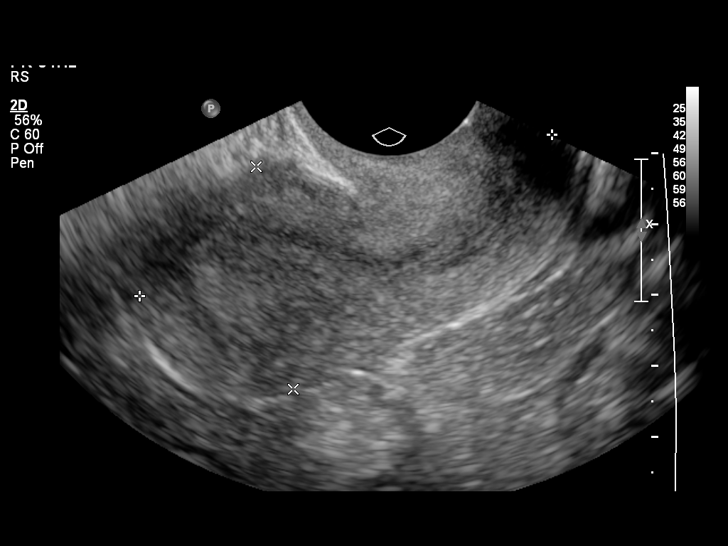
[im 4/39]
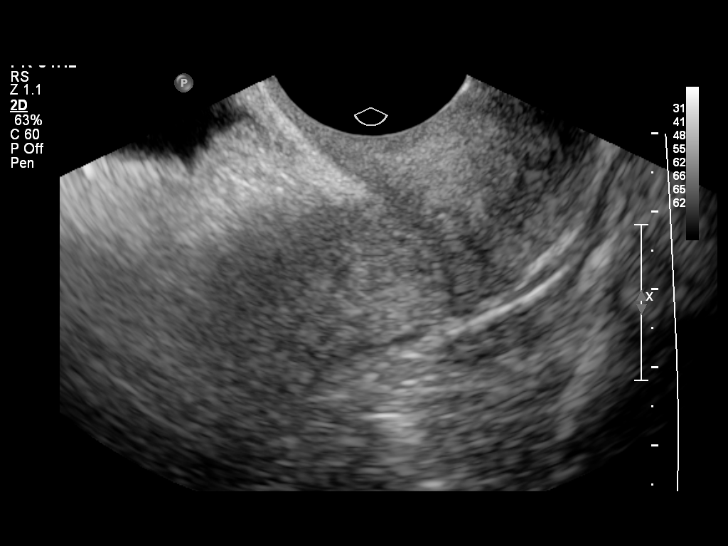
[im 7/39]
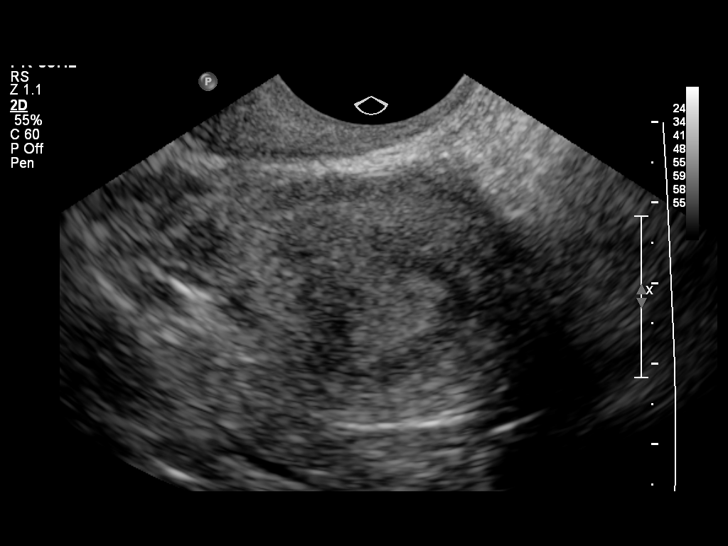
[im 10/39]
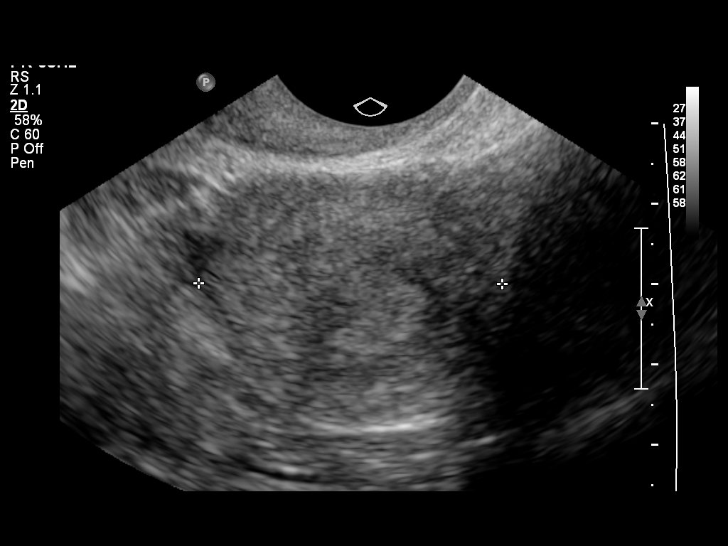
[im 13/39]
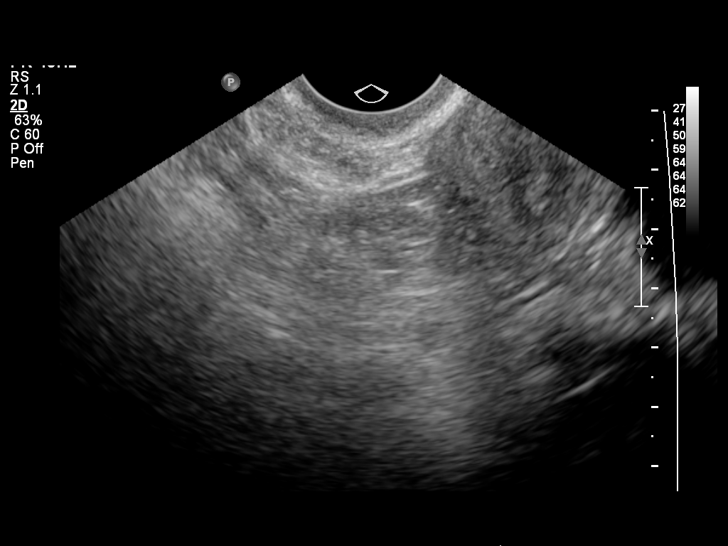
[im 15/39]
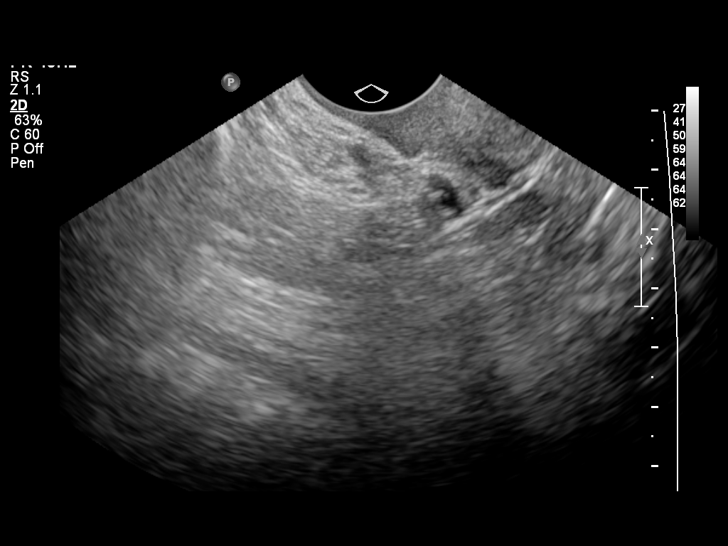
[im 18/39]
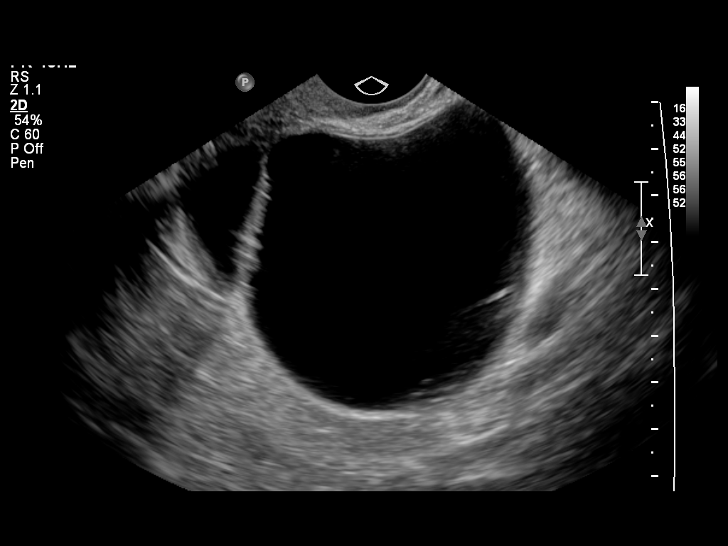
[im 21/39]
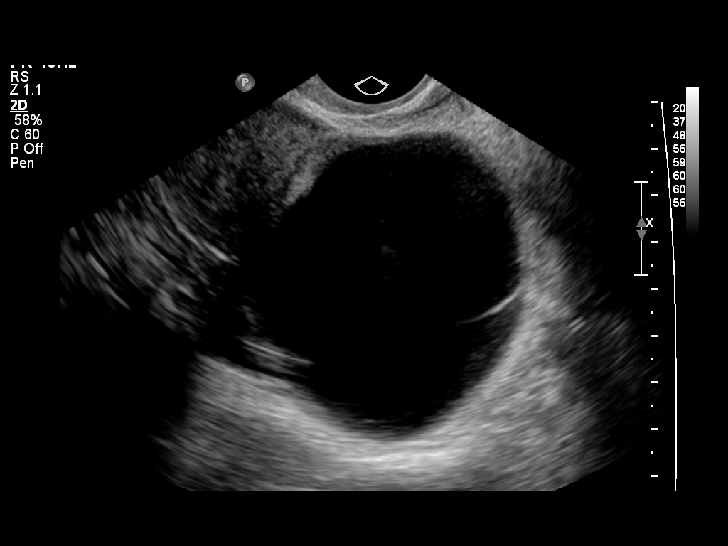
[im 24/39]
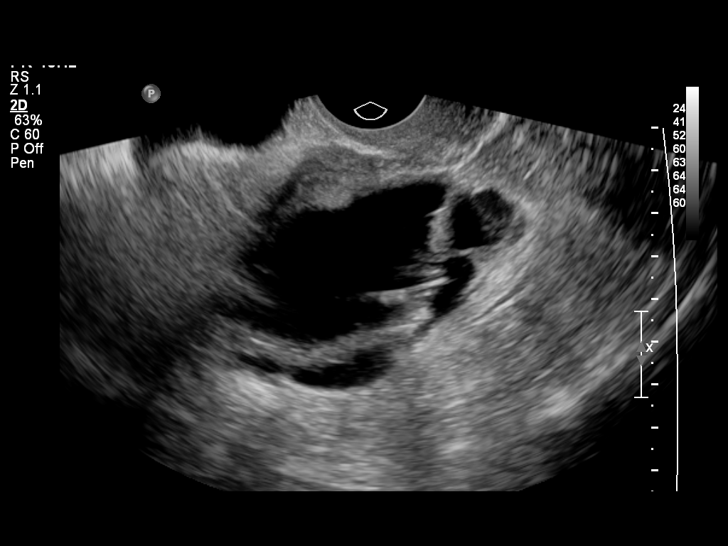
[im 26/39]
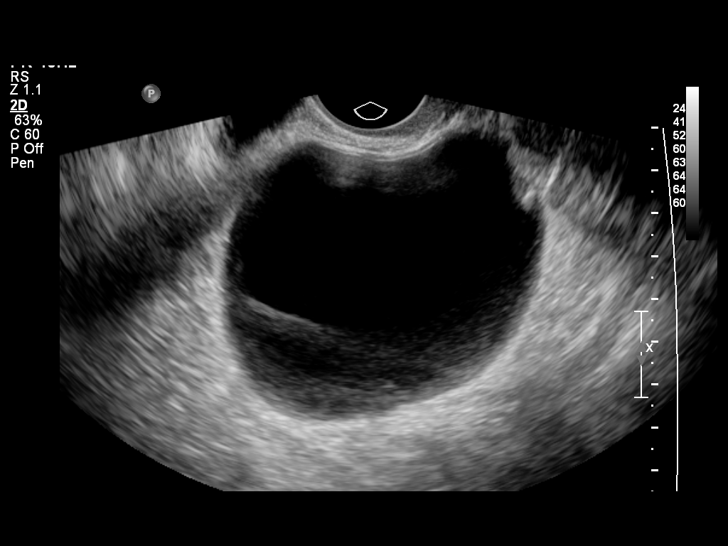
[im 29/39]
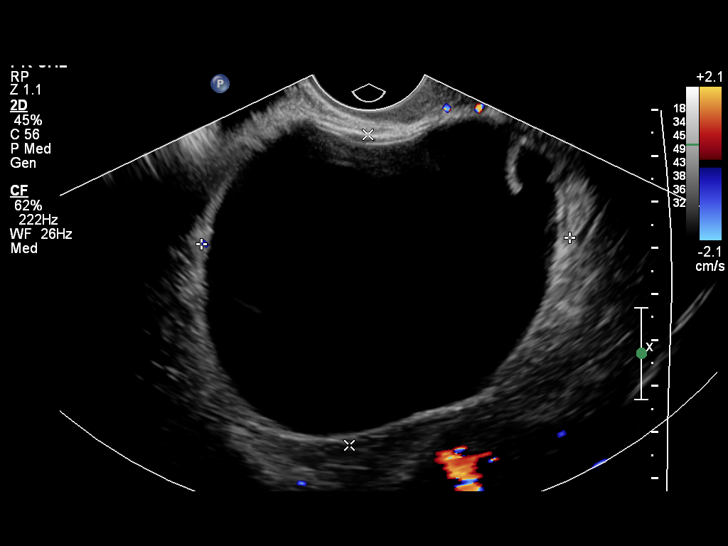
[im 32/39]
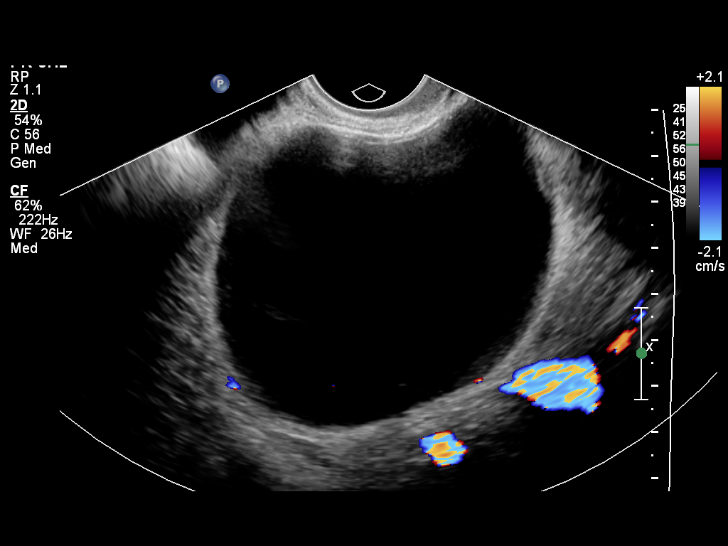
[im 35/39]
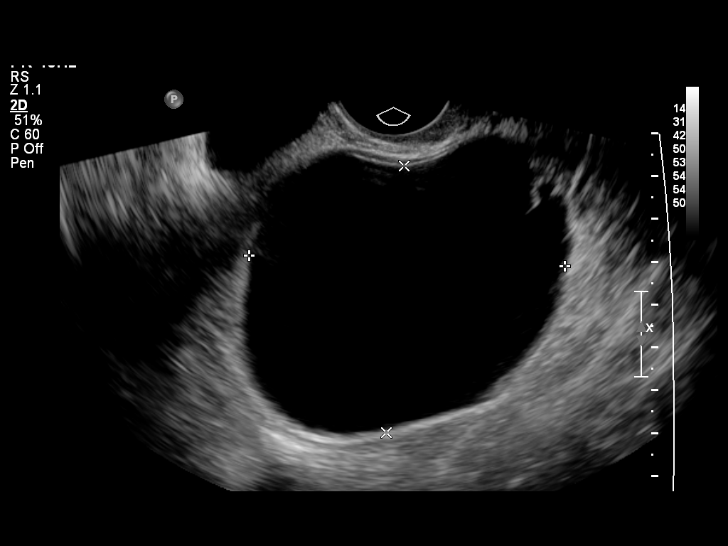
[im 39/39]
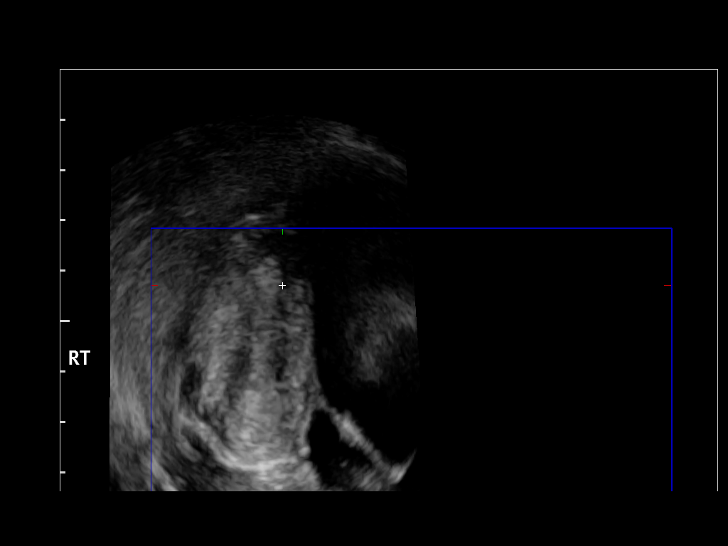

[14 of 25 positions shown; findings below may reference images not displayed]

FINDINGS: Uterus

Measurements: 6.25 x 3.18 x 3.78 cm. No fibroids or other mass
visualized.

Endometrium

Thickness: 10.3 mm.  No focal abnormality visualized.

Right ovary

Status post right oophorectomy

Left ovary

Measurements: 8 x 6.7 x 8.1 cm. A complex ovarian mass with
septations again appreciated measuring 7.4 x 6.2 x 7.8 cm unchanged
from prior. The mass again contains thickened septation with the
areas of flow. This finding is again highly suspicious for cystic
ovarian neoplasm. Malignancy cannot be excluded.

Other findings:  No free fluid
IMPRESSION: Complex cystic ovarian mass on the left. This finding is stable from
prior study and again is concerning for cystic ovarian neoplasia.
The patient has been reportedly scheduled for surgery.

No further sonographic abnormalities.

## 2015-09-08 ENCOUNTER — Telehealth: Payer: Self-pay | Admitting: *Deleted

## 2015-09-08 ENCOUNTER — Other Ambulatory Visit: Payer: Self-pay | Admitting: Hematology and Oncology

## 2015-09-08 ENCOUNTER — Ambulatory Visit (HOSPITAL_COMMUNITY): Payer: BLUE CROSS/BLUE SHIELD

## 2015-09-08 NOTE — Telephone Encounter (Signed)
She cancelled her CT I will still see her on Monday to review all other results without CT

## 2015-09-08 NOTE — Telephone Encounter (Signed)
Voicemail: "I was to have an xray done today but wasn't able to.  Please have collaborative nurse call me."

## 2015-09-09 ENCOUNTER — Other Ambulatory Visit: Payer: Self-pay | Admitting: *Deleted

## 2015-09-09 NOTE — Telephone Encounter (Signed)
Pt notified that Dr Alvy Bimler will see her on Monday

## 2015-09-12 ENCOUNTER — Encounter: Payer: Self-pay | Admitting: Hematology and Oncology

## 2015-09-12 ENCOUNTER — Telehealth: Payer: Self-pay | Admitting: Hematology and Oncology

## 2015-09-12 ENCOUNTER — Ambulatory Visit (HOSPITAL_BASED_OUTPATIENT_CLINIC_OR_DEPARTMENT_OTHER): Payer: BLUE CROSS/BLUE SHIELD | Admitting: Hematology and Oncology

## 2015-09-12 VITALS — BP 133/76 | HR 64 | Temp 97.8°F | Resp 18 | Wt 156.9 lb

## 2015-09-12 DIAGNOSIS — D696 Thrombocytopenia, unspecified: Secondary | ICD-10-CM

## 2015-09-12 DIAGNOSIS — D472 Monoclonal gammopathy: Secondary | ICD-10-CM | POA: Diagnosis not present

## 2015-09-12 DIAGNOSIS — R748 Abnormal levels of other serum enzymes: Secondary | ICD-10-CM | POA: Diagnosis not present

## 2015-09-12 DIAGNOSIS — M0579 Rheumatoid arthritis with rheumatoid factor of multiple sites without organ or systems involvement: Secondary | ICD-10-CM

## 2015-09-12 NOTE — Telephone Encounter (Signed)
Gave pt appt for oct/nov appt & avs

## 2015-09-12 NOTE — Assessment & Plan Note (Signed)
The cause the cause is unknown but could be related to rheumatoid arthritis. Fatty liver disease cannot be excluded. Her liver enzymes are quite elevated. Hepatitis panel is negative. The patient has declined CT imaging study. I recommend observation only for this moment in time and recommend dietary modification and weight loss that should help with fatty liver disease.

## 2015-09-12 NOTE — Progress Notes (Signed)
[place] NOTE  Brandi Dolly, MD SUMMARY OF HEMATOLOGIC HISTORY:  Abnormal M spike, on background history of rheumatoid arthritis and abnormal liver enzymes with splenomegaly  HISTORY OF PRESENTING ILLNESS:  [name] 50 y.o. female is here because of abnormal blood work. She has rheumatoid arthritis and is undergoing chronic treatment. She was treated with methotrexate in the past, over 15 years ago. She has been on intermittent doses of prednisone and prior treatment with Enbrel. Most recently, she was started on Orencia. Her rheumatologist order SPEP which came back abnormal and hence she is referred here. The patient was under the impression she is being referred here because of abnormalities in the liver. On further review, she was noted to have abnormal liver function tests. She had imaging study done in her abdomen many years ago which show evidence of splenomegaly. She is not aware of prior diagnosis of hepatitis. On further review of her blood work, she is also noted to have thrombocytopenia.The patient denies any recent signs or symptoms of bleeding such as spontaneous epistaxis, hematuria or hematochezia. She has never received transfusions before.  She denies history of abnormal bone pain or bone fracture. Patient denies history of recurrent infection or atypical infections such as shingles of meningitis. Denies chills, night sweats, anorexia or abnormal weight loss. She had history of seizure disorder but has not taken any medications recently. 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 female returns for further follow-up. She feels well. Denies recent seizure. She has declined CT imaging  I have reviewed the past medical history, past surgical history, social history and family history with the patient and they are  unchanged from previous note.  ALLERGIES:  is allergic to celebrex and sulfur.  MEDICATIONS:  Current Outpatient Prescriptions  Medication Sig Dispense Refill  . levETIRAcetam (KEPPRA) 500 MG tablet Take 1 tablet twice a day 60 tablet 11  . ORENCIA CLICKJECT 0000000 MG/ML SOAJ Inject 1 vial into the skin every 7 (seven) days. Once a week  1  . clindamycin (CLEOCIN) 150 MG capsule Take 150 mg by mouth 4 (four) times daily. Reported on 09/12/2015    . diazepam (DIASTAT ACUDIAL) 10 MG GEL Place 10 mg rectally once as needed (sustained seizure). (Patient not taking: Reported on 09/12/2015) 1 Package 1  . HYDROcodone-acetaminophen (NORCO) 7.5-325 MG tablet Take 1 tablet by mouth every 6 (six) hours as needed for moderate pain. Reported on 09/12/2015    . ondansetron (ZOFRAN-ODT) 8 MG disintegrating tablet Reported on 09/12/2015  0  . predniSONE (DELTASONE) 5 MG tablet Take 5 mg by mouth. Reported on 09/12/2015  0   No current facility-administered medications for this visit.     REVIEW OF SYSTEMS:   Constitutional: Denies fevers, chills or night sweats Eyes: Denies blurriness of vision Ears, nose, mouth, throat, and face: Denies mucositis or sore throat Respiratory: Denies cough, dyspnea or wheezes Cardiovascular: Denies palpitation, chest discomfort or lower extremity swelling Gastrointestinal:  Denies nausea, heartburn or change in bowel habits Skin: Denies abnormal skin rashes Lymphatics: Denies new lymphadenopathy or easy bruising Neurological:Denies numbness, tingling or new weaknesses Behavioral/Psych: Mood is stable, no new changes  All other systems were reviewed with the patient and are negative.  PHYSICAL EXAMINATION: ECOG PERFORMANCE STATUS: 0 - Asymptomatic  Filed Vitals:   09/12/15 0907  BP: 133/76  Pulse: 64  Temp: 97.8 F (36.6 C)  Resp: 18   Filed Weights   09/12/15 0907  Weight: 156 lb 14.4 oz (71.169 kg)    GENERAL:alert, no distress and comfortable SKIN: skin color,  texture, turgor are normal, no rashes or significant lesions EYES: normal, Conjunctiva are pink and non-injected, sclera clear Musculoskeletal:no cyanosis of digits and no clubbing  NEURO: alert & oriented x 3 with fluent speech, no focal motor/sensory deficits  LABORATORY DATA:  I have reviewed the data as listed     Component Value Date/Time   NA 143 08/18/2015 1156   NA 138 08/06/2015 0928   K 3.5 08/18/2015 1156   K 5.6* 08/06/2015 0928   CL 105 08/06/2015 0928   CO2 26 08/18/2015 1156   CO2 20* 08/06/2015 0928   GLUCOSE 107 08/18/2015 1156   GLUCOSE 119* 08/06/2015 0928   BUN 12.4 08/18/2015 1156   BUN 7 08/06/2015 0928   CREATININE 0.9 08/18/2015 1156   CREATININE 0.69 08/06/2015 0928   CREATININE 0.65 09/08/2013 1414   CALCIUM 9.1 08/18/2015 1156   CALCIUM 9.1 08/06/2015 0928   PROT 7.7 08/18/2015 1156   PROT 7.2 08/18/2015 1155   PROT 7.1 08/06/2015 0928   ALBUMIN 4.0 08/18/2015 1156   ALBUMIN 4.2 08/06/2015 0928   AST 66* 08/18/2015 1156   AST 94* 08/06/2015 0928   ALT 117* 08/18/2015 1156   ALT 108* 08/06/2015 0928   ALKPHOS 70 08/18/2015 1156   ALKPHOS 71 08/06/2015 0928   BILITOT 1.00 08/18/2015 1156   BILITOT 2.7* 08/06/2015 0928   GFRNONAA >60 08/06/2015 0928   GFRAA >60 08/06/2015 0928    No results found for: SPEP, UPEP  Lab Results  Component Value Date   WBC 5.8 08/18/2015   NEUTROABS 2.9 08/18/2015   HGB 14.5 08/18/2015   HCT 41.9 08/18/2015   MCV 92.8 08/18/2015   PLT 125* 08/18/2015      Chemistry      Component Value Date/Time   NA 143 08/18/2015 1156   NA 138 08/06/2015 0928   K 3.5 08/18/2015 1156   K 5.6* 08/06/2015 0928   CL 105 08/06/2015 0928   CO2 26 08/18/2015 1156   CO2 20* 08/06/2015 0928   BUN 12.4 08/18/2015 1156   BUN 7 08/06/2015 0928   CREATININE 0.9 08/18/2015 1156   CREATININE 0.69 08/06/2015 0928   CREATININE 0.65 09/08/2013 1414      Component Value Date/Time   CALCIUM 9.1 08/18/2015 1156   CALCIUM 9.1  08/06/2015 0928   ALKPHOS 70 08/18/2015 1156   ALKPHOS 71 08/06/2015 0928   AST 66* 08/18/2015 1156   AST 94* 08/06/2015 0928   ALT 117* 08/18/2015 1156   ALT 108* 08/06/2015 0928   BILITOT 1.00 08/18/2015 1156   BILITOT 2.7* 08/06/2015 0928       ASSESSMENT & PLAN:  MGUS (monoclonal gammopathy of unknown significance) Additional tests showed no evidence of organ damage.  I briefly spent some time educating the patient natural history of MGUS. I plan to see her back in 6 months for repeat history, physical examination and blood work. If her blood tests remain stable, I will see her on a yearly basis   Thrombocytopenia, unspecified ([place]) The cause is multi-factorial, likely related to autoimmune disorder, liver disease and splenomegaly. She is not symptomatic. Observe for now.   Rheumatoid arthritis involving multiple sites with positive rheumatoid factor ([place]) She is undergoing evaluation and treatment by rheumatologists. Sometimes, rheumatoid arthritis can cause autoimmune thrombocytopenia  and splenomegaly I will defer to them for further management. I am wondering whether the abnormal liver enzymes could also be related to rheumatoid arthritis  Elevated liver enzymes The cause the cause is unknown but could be related to rheumatoid arthritis. Fatty liver disease cannot be excluded. Her liver enzymes are quite elevated. Hepatitis panel is negative. The patient has declined CT imaging study. I recommend observation only for this moment in time and recommend dietary modification and weight loss that should help with fatty liver disease.   All questions were answered. The patient knows to call the clinic with any problems, questions or concerns. No barriers to learning was detected.  I spent 15 minutes counseling the patient face to face. The total time spent in the appointment was 20 minutes and more than 50% was on counseling.     [facility] Donta Fuster, MD 5/1/20179:32 AM

## 2015-09-12 NOTE — Assessment & Plan Note (Signed)
She is undergoing evaluation and treatment by rheumatologists. Sometimes, rheumatoid arthritis can cause autoimmune thrombocytopenia and splenomegaly I will defer to them for further management. I am wondering whether the abnormal liver enzymes could also be related to rheumatoid arthritis

## 2015-09-12 NOTE — Assessment & Plan Note (Signed)
Additional tests showed no evidence of organ damage.  I briefly spent some time educating the patient natural history of MGUS. I plan to see her back in 6 months for repeat history, physical examination and blood work. If her blood tests remain stable, I will see her on a yearly basis

## 2015-09-12 NOTE — Assessment & Plan Note (Signed)
The cause is multi-factorial, likely related to autoimmune disorder, liver disease and splenomegaly. She is not symptomatic. Observe for now.

## 2015-11-14 ENCOUNTER — Ambulatory Visit: Payer: BLUE CROSS/BLUE SHIELD | Admitting: Neurology

## 2015-11-16 ENCOUNTER — Other Ambulatory Visit (HOSPITAL_COMMUNITY): Payer: Self-pay | Admitting: Physician Assistant

## 2015-11-16 DIAGNOSIS — R945 Abnormal results of liver function studies: Principal | ICD-10-CM

## 2015-11-16 DIAGNOSIS — K760 Fatty (change of) liver, not elsewhere classified: Secondary | ICD-10-CM

## 2015-11-16 DIAGNOSIS — R7989 Other specified abnormal findings of blood chemistry: Secondary | ICD-10-CM

## 2015-11-23 ENCOUNTER — Ambulatory Visit (HOSPITAL_COMMUNITY)
Admission: RE | Admit: 2015-11-23 | Discharge: 2015-11-23 | Disposition: A | Discharge status: Home / Self Care | Payer: BLUE CROSS/BLUE SHIELD | Source: Ambulatory Visit | Attending: Physician Assistant | Admitting: Physician Assistant

## 2015-11-23 DIAGNOSIS — K760 Fatty (change of) liver, not elsewhere classified: Secondary | ICD-10-CM

## 2015-11-23 DIAGNOSIS — R945 Abnormal results of liver function studies: Secondary | ICD-10-CM

## 2015-11-23 DIAGNOSIS — R7989 Other specified abnormal findings of blood chemistry: Secondary | ICD-10-CM | POA: Diagnosis not present

## 2016-03-08 ENCOUNTER — Other Ambulatory Visit: Payer: BLUE CROSS/BLUE SHIELD

## 2016-03-09 ENCOUNTER — Ambulatory Visit: Payer: BLUE CROSS/BLUE SHIELD | Admitting: Neurology

## 2016-03-09 DIAGNOSIS — Z029 Encounter for administrative examinations, unspecified: Secondary | ICD-10-CM

## 2016-03-15 ENCOUNTER — Ambulatory Visit: Payer: BLUE CROSS/BLUE SHIELD | Admitting: Hematology and Oncology

## 2016-04-18 DIAGNOSIS — H9042 Sensorineural hearing loss, unilateral, left ear, with unrestricted hearing on the contralateral side: Secondary | ICD-10-CM | POA: Diagnosis not present

## 2016-04-18 DIAGNOSIS — Z6826 Body mass index (BMI) 26.0-26.9, adult: Secondary | ICD-10-CM | POA: Diagnosis not present

## 2016-04-18 DIAGNOSIS — M0589 Other rheumatoid arthritis with rheumatoid factor of multiple sites: Secondary | ICD-10-CM | POA: Diagnosis not present

## 2016-04-18 DIAGNOSIS — Z79899 Other long term (current) drug therapy: Secondary | ICD-10-CM | POA: Diagnosis not present

## 2016-04-18 DIAGNOSIS — D472 Monoclonal gammopathy: Secondary | ICD-10-CM | POA: Diagnosis not present

## 2016-04-18 DIAGNOSIS — R7989 Other specified abnormal findings of blood chemistry: Secondary | ICD-10-CM | POA: Diagnosis not present

## 2016-04-18 DIAGNOSIS — R569 Unspecified convulsions: Secondary | ICD-10-CM | POA: Diagnosis not present

## 2016-04-18 DIAGNOSIS — E663 Overweight: Secondary | ICD-10-CM | POA: Diagnosis not present

## 2016-04-18 DIAGNOSIS — L608 Other nail disorders: Secondary | ICD-10-CM | POA: Diagnosis not present

## 2016-04-26 DIAGNOSIS — H902 Conductive hearing loss, unspecified: Secondary | ICD-10-CM | POA: Diagnosis not present

## 2016-04-26 DIAGNOSIS — H9042 Sensorineural hearing loss, unilateral, left ear, with unrestricted hearing on the contralateral side: Secondary | ICD-10-CM | POA: Diagnosis not present

## 2016-06-19 ENCOUNTER — Encounter: Payer: Self-pay | Admitting: Family Medicine

## 2016-06-19 ENCOUNTER — Ambulatory Visit (INDEPENDENT_AMBULATORY_CARE_PROVIDER_SITE_OTHER): Payer: BLUE CROSS/BLUE SHIELD | Admitting: Family Medicine

## 2016-06-19 ENCOUNTER — Other Ambulatory Visit: Payer: Self-pay | Admitting: *Deleted

## 2016-06-19 VITALS — BP 110/72 | HR 94 | Temp 98.2°F | Wt 154.0 lb

## 2016-06-19 DIAGNOSIS — A09 Infectious gastroenteritis and colitis, unspecified: Secondary | ICD-10-CM

## 2016-06-19 MED ORDER — TRAMADOL HCL 50 MG PO TABS: 50.0000 mg | ORAL_TABLET | Freq: Three times a day (TID) | ORAL | 0 refills | Status: DC | PRN

## 2016-06-19 MED ORDER — CIPROFLOXACIN HCL 500 MG PO TABS: 500.0000 mg | ORAL_TABLET | Freq: Two times a day (BID) | ORAL | 0 refills | Status: DC

## 2016-06-19 MED ORDER — LOPERAMIDE HCL 2 MG PO TABS: 2.0000 mg | ORAL_TABLET | Freq: Four times a day (QID) | ORAL | 0 refills | Status: DC | PRN

## 2016-06-19 NOTE — Patient Instructions (Addendum)
We will check a stool sample.  Please take the cipro twice a day for 3 days.  Let us know if it is worsening or not getting better.  Take care,  Dr Jerline Pain

## 2016-06-19 NOTE — Progress Notes (Signed)
    Subjective:  [name] is a 51 y.o. female who presents to the [facility] today with a chief complaint of abdominal Oliver and diarrhea  HPI:  Abdominal Oliver and Diarrhea Symptoms started abotu 4 days ago with Oliver and diarrhea. Oliver is mostly located in the mid part of her abdomen. She has not noticed anything that makes the Oliver better or worse. No vomiting. Did have a mild fever last night. No sick contacts. She has not tried any medications. No antibiotics. She has been to keep down some fluids. Oliver is not worse with eating. Oliver is not worsened or improved with defecation. Diarrhea is mostly watery. No blood or mucous noted. She has been having 4-5 episodes of diarrhea per day.   ROS: Per HPI  PMH: Smoking history reviewed.   Objective:  Physical Exam: BP 110/72   Pulse 94   Temp 98.2 F (36.8 C) (Oral)   Wt 154 lb (69.9 kg)   SpO2 98%   BMI 26.43 kg/m   Gen: NAD, resting comfortably HEENT: MMM CV: RRR with no murmurs appreciated Pulm: NWOB, CTAB with no crackles, wheezes, or rhonchi GI: Obese, Normal bowel sounds present. Soft. Nondistended. Very tender to palpation in epigastric area. No RLQ tenderness. No tenderness and McBurney's point. Murphy negative. Voluntary guarding noted. No rebound tenderness.  Skin: warm, dry, normal cap refill Neuro: grossly normal, moves all extremities Psych: Normal affect and thought content  Assessment/Plan:  Diarrhea Given the severity of her Oliver, will empirically treat with Cipro. Will also treat symptomatically with tramadol and loperamide. Would expect some vomiting if this were a viral process. No signs of dehydration. Will send for GI pathogen panel. Patient is very tender on her abdominal exam but has no peritoneal signs - doubt acute process such as obstruction or appendicitis. Strict return precautions reviewed. Follow up as needed.   Algis Greenhouse. [name], [place] Resident PGY-3 06/19/2016 10:44 AM

## 2016-06-25 NOTE — Progress Notes (Signed)
Cancelling order as lab not collected, no need to re-order 

## 2016-06-26 ENCOUNTER — Telehealth: Payer: Self-pay | Admitting: Student

## 2016-06-26 LAB — GASTROINTESTINAL PATHOGEN PANEL PCR
C. difficile Tox A/B, PCR: DETECTED — CR
CRYPTOSPORIDIUM, PCR: NOT DETECTED
Campylobacter, PCR: NOT DETECTED
E COLI (ETEC) LT/ST, PCR: NOT DETECTED
E coli (STEC) stx1/stx2, PCR: NOT DETECTED
E coli 0157, PCR: NOT DETECTED
GIARDIA LAMBLIA, PCR: DETECTED — AB
NOROVIRUS, PCR: NOT DETECTED
Rotavirus A, PCR: NOT DETECTED
Salmonella, PCR: NOT DETECTED
Shigella, PCR: NOT DETECTED

## 2016-06-26 NOTE — Telephone Encounter (Signed)
[facility] labs called to let the team know her GI pathogen panel positive for giardia and c. Diff. Consider treatment if indicated

## 2016-06-26 NOTE — Telephone Encounter (Signed)
Will forward to Dr. Jerline Pain who ordered test.

## 2016-06-27 NOTE — Telephone Encounter (Signed)
Called patient. No longer having diarrhea or abdominal pain. Discussed lab results. Given that she is now asymptomatic, deferred treatment. Reasons to return to care discussed. Patient voiced understanding and had no further questions.  Algis Greenhouse. Jerline Pain, [place] Resident PGY-3 06/27/2016 10:18 AM

## 2016-10-17 DIAGNOSIS — D472 Monoclonal gammopathy: Secondary | ICD-10-CM | POA: Diagnosis not present

## 2016-10-17 DIAGNOSIS — E663 Overweight: Secondary | ICD-10-CM | POA: Diagnosis not present

## 2016-10-17 DIAGNOSIS — M0589 Other rheumatoid arthritis with rheumatoid factor of multiple sites: Secondary | ICD-10-CM | POA: Diagnosis not present

## 2016-10-17 DIAGNOSIS — R7989 Other specified abnormal findings of blood chemistry: Secondary | ICD-10-CM | POA: Diagnosis not present

## 2016-10-17 DIAGNOSIS — H9042 Sensorineural hearing loss, unilateral, left ear, with unrestricted hearing on the contralateral side: Secondary | ICD-10-CM | POA: Diagnosis not present

## 2016-10-17 DIAGNOSIS — L608 Other nail disorders: Secondary | ICD-10-CM | POA: Diagnosis not present

## 2016-10-17 DIAGNOSIS — R569 Unspecified convulsions: Secondary | ICD-10-CM | POA: Diagnosis not present

## 2016-10-17 DIAGNOSIS — Z6826 Body mass index (BMI) 26.0-26.9, adult: Secondary | ICD-10-CM | POA: Diagnosis not present

## 2016-10-17 DIAGNOSIS — Z79899 Other long term (current) drug therapy: Secondary | ICD-10-CM | POA: Diagnosis not present

## 2016-10-22 ENCOUNTER — Telehealth: Payer: Self-pay

## 2016-10-22 ENCOUNTER — Other Ambulatory Visit: Payer: Self-pay | Admitting: Hematology and Oncology

## 2016-10-22 DIAGNOSIS — E538 Deficiency of other specified B group vitamins: Secondary | ICD-10-CM

## 2016-10-22 DIAGNOSIS — D472 Monoclonal gammopathy: Secondary | ICD-10-CM

## 2016-10-22 NOTE — Telephone Encounter (Signed)
Patient called and left message that she needs a appointment with Dr. Alvy Bimler. She states that she had a appointment with her primary MD and her platelet count m- spiked and she was told to see Dr. Alvy Bimler.

## 2016-10-22 NOTE — Telephone Encounter (Signed)
Called with below message. 

## 2016-10-22 NOTE — Telephone Encounter (Signed)
She was no-showed before I will place scheduling msg

## 2016-10-24 ENCOUNTER — Telehealth: Payer: Self-pay | Admitting: Hematology and Oncology

## 2016-10-24 NOTE — Telephone Encounter (Signed)
July schedule mailed.

## 2016-10-31 ENCOUNTER — Ambulatory Visit: Payer: BLUE CROSS/BLUE SHIELD

## 2016-10-31 ENCOUNTER — Ambulatory Visit (INDEPENDENT_AMBULATORY_CARE_PROVIDER_SITE_OTHER): Payer: BLUE CROSS/BLUE SHIELD

## 2016-10-31 ENCOUNTER — Encounter: Payer: Self-pay | Admitting: Podiatry

## 2016-10-31 ENCOUNTER — Ambulatory Visit (INDEPENDENT_AMBULATORY_CARE_PROVIDER_SITE_OTHER): Payer: BLUE CROSS/BLUE SHIELD | Admitting: Podiatry

## 2016-10-31 VITALS — BP 165/86 | HR 75 | Resp 16 | Ht 64.5 in | Wt 160.0 lb

## 2016-10-31 DIAGNOSIS — D169 Benign neoplasm of bone and articular cartilage, unspecified: Secondary | ICD-10-CM | POA: Diagnosis not present

## 2016-10-31 DIAGNOSIS — L84 Corns and callosities: Secondary | ICD-10-CM | POA: Diagnosis not present

## 2016-10-31 DIAGNOSIS — M21621 Bunionette of right foot: Secondary | ICD-10-CM

## 2016-10-31 DIAGNOSIS — M21622 Bunionette of left foot: Secondary | ICD-10-CM

## 2016-10-31 DIAGNOSIS — M2041 Other hammer toe(s) (acquired), right foot: Secondary | ICD-10-CM

## 2016-10-31 DIAGNOSIS — M79672 Pain in left foot: Secondary | ICD-10-CM

## 2016-10-31 NOTE — Progress Notes (Signed)
   Subjective:    Patient ID: Fransisca Kaufmann, female    DOB: 04/18/1966, 51 y.o.   MRN: [id number]  HPI Chief Complaint  Patient presents with  . Foot Pain    Left foot; dorsal; pt stated, "Has Rheumatoid Arthritis; feels like has knot on top of foot; stingsf and pulls when walking; hurts to wear shoes"; x1 yr  . Callouses    Bilateral; plantar forefoot-below 5th toes; x2 yrs  . Callouses    Bilateral; 4th toes-lateral side; pt stated, "Right is worse than the left"; x3 yrs      Review of Systems  HENT: Positive for hearing loss and sneezing.   Respiratory: Positive for wheezing.   Skin: Positive for rash.  Hematological: Bruises/bleeds easily.  All other systems reviewed and are negative.      Objective:   Physical Exam        Assessment & Plan:

## 2016-10-31 NOTE — Patient Instructions (Signed)

## 2016-10-31 NOTE — Progress Notes (Signed)
Subjective:    Patient ID: [name], female   DOB: 51 y.o.   MRN: [id number]   HPI patient presents with significant spurring of the dorsum of the left foot severe pain in the fifth metatarsal of both feet and on the right foot keratotic lesion fourth digit that's painful when pressed and makes walking and shoe gear difficult. Patient states it's gotten worse over the last couple years    Review of Systems  All other systems reviewed and are negative.       Objective:  Physical Exam  Constitutional: She is oriented to person, place, and time.  Cardiovascular: Intact distal pulses.   Musculoskeletal: Normal range of motion.  Neurological: She is alert and oriented to person, place, and time.  Skin: Skin is warm.  Nursing note and vitals reviewed.  neurovascular status intact muscle strength was adequate range of motion within normal limits with patient found to have severe keratotic lesion fifth metatarsal bilateral with elevated fifth digits and a keratotic lesion fourth interspace right. On the left there is a spur at the first metatarsocuneiform joint that's very sore. 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 I've recommended due to the long-term pain she is in and the type of physical condition she is in that I went ahead and I discussed surgical intervention in this case. She wants to have this done understanding risk of surgery and I allowed her to read her consent form today. She will have fifth metatarsal head resection bilateral hammertoe repair fourth right foot and removal of spur left. I spent a great deal time going over alternative  treatments complications and she only wants surgery and at this time she signs consent form after extensive review and is encouraged to call with questions. She is given time for surgery in the next several weeks and will have surgery performed

## 2016-11-01 ENCOUNTER — Telehealth: Payer: Self-pay | Admitting: *Deleted

## 2016-11-01 NOTE — Telephone Encounter (Signed)
Lets get the results of her platelets and then we need to call her doctor tomorrow and see if ok for surgery

## 2016-11-01 NOTE — Telephone Encounter (Signed)
I left patient a message that Dr. Paulla Dolly said he needs to get your platelet results and speak with your doctor on tomorrow.  I asked her to call and speak with Marylou Mccoy on tomorrow.  I was going to send a medical clearance letter to Rheumatologist but I do not know who it is.  Janett Billow please ask patient.

## 2016-11-01 NOTE — Telephone Encounter (Signed)
"  I was in there yesterday and we scheduled me to have surgery on Tuesday.  The reason I'm calling, I forgot to mention this yesterday.  I wanted to see if this will be a problem. If it is, we probably need to reschedule.  My Rheumatologist set me up to go see my doctor at the [facility] because my platelets were N-spiked.  I'm just wondering if I need to reschedule this or is that something I can go ahead and do?  I'm not to go see the doctor until next month.  If someone can give me a call back, I can go into more detail."

## 2016-11-05 NOTE — Telephone Encounter (Signed)
I'm returning your call.  You wanted to cancel your surgery.  "Yes, I do because I think I will hold off until I see Dr. Alvy Bimler on July 10 about my platelets.  I figured it's not enough time to get all questions answered at this time.  I will call back when I'm ready to reschedule."  I'll let Dr. Paulla Dolly know.  I called and informed Caren Griffins at [facility] to cancel the surgery.

## 2016-11-15 ENCOUNTER — Encounter: Payer: BLUE CROSS/BLUE SHIELD | Admitting: Podiatry

## 2016-11-20 ENCOUNTER — Other Ambulatory Visit (HOSPITAL_BASED_OUTPATIENT_CLINIC_OR_DEPARTMENT_OTHER): Payer: BLUE CROSS/BLUE SHIELD

## 2016-11-20 ENCOUNTER — Other Ambulatory Visit (HOSPITAL_COMMUNITY)
Admission: RE | Admit: 2016-11-20 | Discharge: 2016-11-20 | Disposition: A | Discharge status: Home / Self Care | Payer: BLUE CROSS/BLUE SHIELD | Source: Other Acute Inpatient Hospital | Attending: Hematology and Oncology | Admitting: Hematology and Oncology

## 2016-11-20 DIAGNOSIS — D472 Monoclonal gammopathy: Secondary | ICD-10-CM | POA: Diagnosis not present

## 2016-11-20 LAB — COMPREHENSIVE METABOLIC PANEL
ALK PHOS: 71 U/L (ref 38–126)
ALT: 32 U/L (ref 14–54)
ANION GAP: 8 (ref 5–15)
AST: 29 U/L (ref 15–41)
Albumin: 3.9 g/dL (ref 3.5–5.0)
BILIRUBIN TOTAL: 0.6 mg/dL (ref 0.3–1.2)
BUN: 11 mg/dL (ref 6–20)
CALCIUM: 8.7 mg/dL — AB (ref 8.9–10.3)
CO2: 26 mmol/L (ref 22–32)
CREATININE: 0.8 mg/dL (ref 0.44–1.00)
Chloride: 105 mmol/L (ref 101–111)
GFR calc Af Amer: >60 mL/min (ref >60)
Glucose, Bld: 114 mg/dL — ABNORMAL HIGH (ref 65–99)
Potassium: 3.4 mmol/L — ABNORMAL LOW (ref 3.5–5.1)
SODIUM: 139 mmol/L (ref 135–145)
TOTAL PROTEIN: 7.2 g/dL (ref 6.5–8.1)

## 2016-11-20 LAB — CBC WITH DIFFERENTIAL/PLATELET
BASO%: 0.2 % (ref 0.0–2.0)
Basophils Absolute: 0 10*3/uL (ref 0.0–0.1)
EOS ABS: 0.2 10*3/uL (ref 0.0–0.5)
EOS%: 2.8 % (ref 0.0–7.0)
HCT: 36.8 % (ref 34.8–46.6)
HEMOGLOBIN: 13.2 g/dL (ref 11.6–15.9)
LYMPH%: 41.1 % (ref 14.0–49.7)
MCH: 32.1 pg (ref 25.1–34.0)
MCHC: 35.9 g/dL (ref 31.5–36.0)
MCV: 89.5 fL (ref 79.5–101.0)
MONO#: 0.4 10*3/uL (ref 0.1–0.9)
MONO%: 7 % (ref 0.0–14.0)
NEUT%: 48.9 % (ref 38.4–76.8)
NEUTROS ABS: 2.6 10*3/uL (ref 1.5–6.5)
Platelets: 130 10*3/uL — ABNORMAL LOW (ref 145–400)
RBC: 4.11 10*6/uL (ref 3.70–5.45)
RDW: 12.3 % (ref 11.2–14.5)
WBC: 5.4 10*3/uL (ref 3.9–10.3)
lymph#: 2.2 10*3/uL (ref 0.9–3.3)

## 2016-11-21 LAB — KAPPA/LAMBDA LIGHT CHAINS
Ig Kappa Free Light Chain: 24.8 mg/L — ABNORMAL HIGH (ref 3.3–19.4)
Ig Lambda Free Light Chain: 269.9 mg/L — ABNORMAL HIGH (ref 5.7–26.3)
KAPPA/LAMBDA FLC RATIO: 0.09 — AB (ref 0.26–1.65)

## 2016-11-22 LAB — MULTIPLE MYELOMA PANEL, SERUM
ALBUMIN/GLOB SERPL: 1.2 (ref 0.7–1.7)
ALPHA2 GLOB SERPL ELPH-MCNC: 0.4 g/dL (ref 0.4–1.0)
Albumin SerPl Elph-Mcnc: 3.6 g/dL (ref 2.9–4.4)
Alpha 1: 0.2 g/dL (ref 0.0–0.4)
B-GLOBULIN SERPL ELPH-MCNC: 1.4 g/dL — AB (ref 0.7–1.3)
Gamma Glob SerPl Elph-Mcnc: 1.1 g/dL (ref 0.4–1.8)
Globulin, Total: 3.1 g/dL (ref 2.2–3.9)
IgA, Qn, Serum: 632 mg/dL — ABNORMAL HIGH (ref 87–352)
IgM, Qn, Serum: 118 mg/dL (ref 26–217)
M PROTEIN SERPL ELPH-MCNC: 0.4 g/dL — AB
Total Protein: 6.7 g/dL (ref 6.0–8.5)

## 2016-11-27 ENCOUNTER — Ambulatory Visit (HOSPITAL_BASED_OUTPATIENT_CLINIC_OR_DEPARTMENT_OTHER): Payer: BLUE CROSS/BLUE SHIELD | Admitting: Hematology and Oncology

## 2016-11-27 ENCOUNTER — Telehealth: Payer: Self-pay | Admitting: Hematology and Oncology

## 2016-11-27 VITALS — BP 124/56 | HR 70 | Temp 98.5°F | Resp 18 | Ht 64.5 in | Wt 157.8 lb

## 2016-11-27 DIAGNOSIS — M0579 Rheumatoid arthritis with rheumatoid factor of multiple sites without organ or systems involvement: Secondary | ICD-10-CM

## 2016-11-27 DIAGNOSIS — D696 Thrombocytopenia, unspecified: Secondary | ICD-10-CM | POA: Diagnosis not present

## 2016-11-27 DIAGNOSIS — D472 Monoclonal gammopathy: Secondary | ICD-10-CM | POA: Diagnosis not present

## 2016-11-27 NOTE — Telephone Encounter (Signed)
Scheduled appt per 7/17 los - Gave patient AVS and calender per los. - lab and f/u next year.

## 2016-11-28 ENCOUNTER — Encounter: Payer: Self-pay | Admitting: Hematology and Oncology

## 2016-11-28 NOTE — Assessment & Plan Note (Signed)
She is undergoing evaluation and treatment by rheumatologists. Sometimes, rheumatoid arthritis can cause autoimmune thrombocytopenia and splenomegaly I will defer to them for further management. The disease is stable I reinforced importance of vitamin D supplement 

## 2016-11-28 NOTE — Progress Notes (Signed)
[place] OFFICE PROGRESS NOTE  Patient Care Team: [name], MD as PCP - General Amil Amen Irven Easterly, MD as Consulting Physician (Rheumatology) Heath Lark, MD as Consulting Physician (Hematology and Oncology)  SUMMARY OF ONCOLOGIC HISTORY:  [name] is here because of abnormal blood work. She has rheumatoid arthritis and is undergoing chronic treatment. She was treated with methotrexate in the past, over 15 years ago. She has been on intermittent doses of prednisone and prior treatment with Enbrel. Most recently, she was started on Orencia. Her rheumatologist order SPEP which came back abnormal and hence she is referred here. The patient was under the impression she is being referred here because of abnormalities in the liver. On further review, she was noted to have abnormal liver function tests. She had imaging study done in her abdomen many years ago which show evidence of splenomegaly. She is not aware of prior diagnosis of hepatitis. On further review of her blood work, she is also noted to have thrombocytopenia.The patient denies any recent signs or symptoms of bleeding such as spontaneous epistaxis, hematuria or hematochezia. She has never received transfusions before.  She denies history of abnormal bone pain or bone fracture. Patient denies history of recurrent infection or atypical infections such as shingles of meningitis. Denies chills, night sweats, anorexia or abnormal weight loss. She had history of seizure disorder but has not taken any medications recently. 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 She returns for further follow-up She is doing well She denies flare of rheumatoid arthritis Denies recent infection No worsening bone pain The patient denies any recent signs or symptoms of bleeding such  as spontaneous epistaxis, hematuria or hematochezia.   REVIEW OF SYSTEMS:   Constitutional: Denies fevers, chills or abnormal weight loss Eyes: Denies blurriness of vision Ears, nose, mouth, throat, and face: Denies mucositis or sore throat Respiratory: Denies cough, dyspnea or wheezes Cardiovascular: Denies palpitation, chest discomfort or lower extremity swelling Gastrointestinal:  Denies nausea, heartburn or change in bowel habits Skin: Denies abnormal skin rashes Lymphatics: Denies new lymphadenopathy or easy bruising Neurological:Denies numbness, tingling or new weaknesses Behavioral/Psych: Mood is stable, no new changes  All other systems were reviewed with the patient and are negative.  I have reviewed the past medical history, past surgical history, social history and family history with the patient and they are unchanged from previous note.  ALLERGIES:  is allergic to celebrex [celecoxib] and sulfur.  MEDICATIONS:  Current Outpatient Prescriptions  Medication Sig Dispense Refill  . ORENCIA CLICKJECT 665 MG/ML SOAJ Inject 1 vial into the skin every 7 (seven) days. Once a week  1  . predniSONE (DELTASONE) 5 MG tablet Take 5 mg by mouth. Reported on 09/12/2015  0   No current facility-administered medications for this visit.     PHYSICAL EXAMINATION: ECOG PERFORMANCE STATUS: 0 - Asymptomatic  Vitals:   11/27/16 1422  BP: (!) 124/56  Pulse: 70  Resp: 18  Temp: 98.5 F (36.9 C)   Filed Weights   11/27/16 1422  Weight: 157 lb 12.8 oz (71.6 kg)    GENERAL:alert, no distress and comfortable SKIN: skin color, texture, turgor are normal, no rashes or significant lesions EYES: normal, Conjunctiva are pink and non-injected, sclera clear OROPHARYNX:no exudate, no erythema and lips, buccal mucosa, and tongue normal  NECK: supple, thyroid normal size, non-tender, without  nodularity LYMPH:  no palpable lymphadenopathy in the cervical, axillary or inguinal LUNGS: clear to  auscultation and percussion with normal breathing effort HEART: regular rate & rhythm and no murmurs and no lower extremity edema ABDOMEN:abdomen soft, non-tender and normal bowel sounds Musculoskeletal:no cyanosis of digits and no clubbing  NEURO: alert & oriented x 3 with fluent speech, no focal motor/sensory deficits  LABORATORY DATA:  I have reviewed the data as listed    Component Value Date/Time   NA 139 11/20/2016 1356   NA 143 08/18/2015 1156   K 3.4 (L) 11/20/2016 1356   K 3.5 08/18/2015 1156   CL 105 11/20/2016 1356   CO2 26 11/20/2016 1356   CO2 26 08/18/2015 1156   GLUCOSE 114 (H) 11/20/2016 1356   GLUCOSE 107 08/18/2015 1156   BUN 11 11/20/2016 1356   BUN 12.4 08/18/2015 1156   CREATININE 0.80 11/20/2016 1356   CREATININE 0.9 08/18/2015 1156   CALCIUM 8.7 (L) 11/20/2016 1356   CALCIUM 9.1 08/18/2015 1156   PROT 6.7 11/20/2016 1356   PROT 7.7 08/18/2015 1156   ALBUMIN 3.9 11/20/2016 1356   ALBUMIN 4.0 08/18/2015 1156   AST 29 11/20/2016 1356   AST 66 (H) 08/18/2015 1156   ALT 32 11/20/2016 1356   ALT 117 (H) 08/18/2015 1156   ALKPHOS 71 11/20/2016 1356   ALKPHOS 70 08/18/2015 1156   BILITOT 0.6 11/20/2016 1356   BILITOT 1.00 08/18/2015 1156   GFRNONAA >60 11/20/2016 1356   GFRAA >60 11/20/2016 1356    No results found for: SPEP, UPEP  Lab Results  Component Value Date   WBC 5.4 11/20/2016   NEUTROABS 2.6 11/20/2016   HGB 13.2 11/20/2016   HCT 36.8 11/20/2016   MCV 89.5 11/20/2016   PLT 130 (L) 11/20/2016      Chemistry      Component Value Date/Time   NA 139 11/20/2016 1356   NA 143 08/18/2015 1156   K 3.4 (L) 11/20/2016 1356   K 3.5 08/18/2015 1156   CL 105 11/20/2016 1356   CO2 26 11/20/2016 1356   CO2 26 08/18/2015 1156   BUN 11 11/20/2016 1356   BUN 12.4 08/18/2015 1156   CREATININE 0.80 11/20/2016 1356   CREATININE 0.9 08/18/2015 1156      Component Value Date/Time   CALCIUM 8.7 (L) 11/20/2016 1356   CALCIUM 9.1 08/18/2015 1156    ALKPHOS 71 11/20/2016 1356   ALKPHOS 70 08/18/2015 1156   AST 29 11/20/2016 1356   AST 66 (H) 08/18/2015 1156   ALT 32 11/20/2016 1356   ALT 117 (H) 08/18/2015 1156   BILITOT 0.6 11/20/2016 1356   BILITOT 1.00 08/18/2015 1156      ASSESSMENT & PLAN:  MGUS (monoclonal gammopathy of unknown significance) Additional tests showed no evidence of organ damage.  I briefly spent some time educating the patient natural history of MGUS. I plan to see her back in 12 months for repeat history, physical examination and blood work.    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 She is not symptomatic. Observe for now.   Rheumatoid arthritis involving multiple sites with positive rheumatoid factor ([place]) She is undergoing evaluation and treatment by rheumatologists. Sometimes, rheumatoid arthritis can cause autoimmune thrombocytopenia and splenomegaly I will defer to them for further management. The disease is stable I reinforced importance of vitamin D supplement   Orders Placed This Encounter  Procedures  . CBC with Differential/Platelet  Standing Status:   Future    Standing Expiration Date:   01/01/2018  . Comprehensive metabolic panel    Standing Status:   Future    Standing Expiration Date:   01/01/2018  . Kappa/lambda light chains    Standing Status:   Future    Standing Expiration Date:   01/01/2018  . Multiple Myeloma Panel (SPEP&IFE w/QIG)    Standing Status:   Future    Standing Expiration Date:   01/01/2018   All questions were answered. The patient knows to call the clinic with any problems, questions or concerns. No barriers to learning was detected. I spent 15 minutes counseling the patient face to face. The total time spent in the appointment was 20 minutes and more than 50% was on counseling and review of test results     Heath Lark, MD 11/28/2016 7:22 PM

## 2016-11-28 NOTE — Assessment & Plan Note (Signed)
Additional tests showed no evidence of organ damage.  I briefly spent some time educating the patient natural history of MGUS. I plan to see her back in 12 months for repeat history, physical examination and blood work.

## 2016-11-28 NOTE — Assessment & Plan Note (Signed)
The cause is multi-factorial, likely related to autoimmune disorder, liver disease and splenomegaly. She is not symptomatic. Observe for now.

## 2017-02-20 DIAGNOSIS — M0589 Other rheumatoid arthritis with rheumatoid factor of multiple sites: Secondary | ICD-10-CM | POA: Diagnosis not present

## 2017-02-20 DIAGNOSIS — R569 Unspecified convulsions: Secondary | ICD-10-CM | POA: Diagnosis not present

## 2017-02-20 DIAGNOSIS — Z79899 Other long term (current) drug therapy: Secondary | ICD-10-CM | POA: Diagnosis not present

## 2017-02-20 DIAGNOSIS — L608 Other nail disorders: Secondary | ICD-10-CM | POA: Diagnosis not present

## 2017-02-20 DIAGNOSIS — D472 Monoclonal gammopathy: Secondary | ICD-10-CM | POA: Diagnosis not present

## 2017-02-20 DIAGNOSIS — Z6826 Body mass index (BMI) 26.0-26.9, adult: Secondary | ICD-10-CM | POA: Diagnosis not present

## 2017-02-20 DIAGNOSIS — H9042 Sensorineural hearing loss, unilateral, left ear, with unrestricted hearing on the contralateral side: Secondary | ICD-10-CM | POA: Diagnosis not present

## 2017-02-20 DIAGNOSIS — R7989 Other specified abnormal findings of blood chemistry: Secondary | ICD-10-CM | POA: Diagnosis not present

## 2017-02-20 DIAGNOSIS — E663 Overweight: Secondary | ICD-10-CM | POA: Diagnosis not present

## 2017-02-26 ENCOUNTER — Encounter: Payer: Self-pay | Admitting: Hematology and Oncology

## 2017-04-29 DIAGNOSIS — Z6827 Body mass index (BMI) 27.0-27.9, adult: Secondary | ICD-10-CM | POA: Diagnosis not present

## 2017-04-29 DIAGNOSIS — R569 Unspecified convulsions: Secondary | ICD-10-CM | POA: Diagnosis not present

## 2017-04-29 DIAGNOSIS — H9042 Sensorineural hearing loss, unilateral, left ear, with unrestricted hearing on the contralateral side: Secondary | ICD-10-CM | POA: Diagnosis not present

## 2017-04-29 DIAGNOSIS — M0589 Other rheumatoid arthritis with rheumatoid factor of multiple sites: Secondary | ICD-10-CM | POA: Diagnosis not present

## 2017-04-29 DIAGNOSIS — Z79899 Other long term (current) drug therapy: Secondary | ICD-10-CM | POA: Diagnosis not present

## 2017-04-29 DIAGNOSIS — L608 Other nail disorders: Secondary | ICD-10-CM | POA: Diagnosis not present

## 2017-04-29 DIAGNOSIS — R7989 Other specified abnormal findings of blood chemistry: Secondary | ICD-10-CM | POA: Diagnosis not present

## 2017-04-29 DIAGNOSIS — D472 Monoclonal gammopathy: Secondary | ICD-10-CM | POA: Diagnosis not present

## 2017-04-29 DIAGNOSIS — E663 Overweight: Secondary | ICD-10-CM | POA: Diagnosis not present

## 2017-06-24 DIAGNOSIS — Z79899 Other long term (current) drug therapy: Secondary | ICD-10-CM | POA: Diagnosis not present

## 2017-06-24 DIAGNOSIS — D472 Monoclonal gammopathy: Secondary | ICD-10-CM | POA: Diagnosis not present

## 2017-06-24 DIAGNOSIS — L608 Other nail disorders: Secondary | ICD-10-CM | POA: Diagnosis not present

## 2017-06-24 DIAGNOSIS — M0589 Other rheumatoid arthritis with rheumatoid factor of multiple sites: Secondary | ICD-10-CM | POA: Diagnosis not present

## 2017-06-24 DIAGNOSIS — Z6827 Body mass index (BMI) 27.0-27.9, adult: Secondary | ICD-10-CM | POA: Diagnosis not present

## 2017-06-24 DIAGNOSIS — E663 Overweight: Secondary | ICD-10-CM | POA: Diagnosis not present

## 2017-06-24 DIAGNOSIS — R7989 Other specified abnormal findings of blood chemistry: Secondary | ICD-10-CM | POA: Diagnosis not present

## 2017-06-24 DIAGNOSIS — R569 Unspecified convulsions: Secondary | ICD-10-CM | POA: Diagnosis not present

## 2017-06-24 DIAGNOSIS — H9042 Sensorineural hearing loss, unilateral, left ear, with unrestricted hearing on the contralateral side: Secondary | ICD-10-CM | POA: Diagnosis not present

## 2017-07-16 DIAGNOSIS — R7989 Other specified abnormal findings of blood chemistry: Secondary | ICD-10-CM | POA: Diagnosis not present

## 2017-07-16 DIAGNOSIS — M0589 Other rheumatoid arthritis with rheumatoid factor of multiple sites: Secondary | ICD-10-CM | POA: Diagnosis not present

## 2017-11-19 ENCOUNTER — Inpatient Hospital Stay: Payer: BLUE CROSS/BLUE SHIELD | Attending: Hematology and Oncology

## 2017-11-19 DIAGNOSIS — D472 Monoclonal gammopathy: Secondary | ICD-10-CM

## 2017-11-19 DIAGNOSIS — D696 Thrombocytopenia, unspecified: Secondary | ICD-10-CM | POA: Diagnosis not present

## 2017-11-19 DIAGNOSIS — M0579 Rheumatoid arthritis with rheumatoid factor of multiple sites without organ or systems involvement: Secondary | ICD-10-CM | POA: Diagnosis not present

## 2017-11-19 LAB — CBC WITH DIFFERENTIAL/PLATELET
BASOS ABS: 0 10*3/uL (ref 0.0–0.1)
BASOS PCT: 0 %
EOS PCT: 1 %
Eosinophils Absolute: 0.1 10*3/uL (ref 0.0–0.5)
HCT: 42 % (ref 34.8–46.6)
Hemoglobin: 15.1 g/dL (ref 11.6–15.9)
Lymphocytes Relative: 40 %
Lymphs Abs: 2.1 10*3/uL (ref 0.9–3.3)
MCH: 32.8 pg (ref 25.1–34.0)
MCHC: 36 g/dL (ref 31.5–36.0)
MCV: 91.1 fL (ref 79.5–101.0)
MONO ABS: 0.2 10*3/uL (ref 0.1–0.9)
Monocytes Relative: 4 %
NEUTROS ABS: 2.9 10*3/uL (ref 1.5–6.5)
Neutrophils Relative %: 55 %
PLATELETS: 114 10*3/uL — AB (ref 145–400)
RBC: 4.61 MIL/uL (ref 3.70–5.45)
RDW: 12.8 % (ref 11.2–14.5)
WBC: 5.3 10*3/uL (ref 3.9–10.3)

## 2017-11-19 LAB — COMPREHENSIVE METABOLIC PANEL
ALK PHOS: 79 U/L (ref 38–126)
ALT: 43 U/L (ref 0–44)
ANION GAP: 9 (ref 5–15)
AST: 23 U/L (ref 15–41)
Albumin: 4.3 g/dL (ref 3.5–5.0)
BUN: 15 mg/dL (ref 6–20)
CHLORIDE: 105 mmol/L (ref 98–111)
CO2: 26 mmol/L (ref 22–32)
CREATININE: 0.95 mg/dL (ref 0.44–1.00)
Calcium: 9.6 mg/dL (ref 8.9–10.3)
GFR calc non Af Amer: >60 mL/min (ref >60)
Glucose, Bld: 128 mg/dL — ABNORMAL HIGH (ref 70–99)
Potassium: 3.8 mmol/L (ref 3.5–5.1)
Sodium: 140 mmol/L (ref 135–145)
Total Bilirubin: 1 mg/dL (ref 0.3–1.2)
Total Protein: 7.9 g/dL (ref 6.5–8.1)

## 2017-11-20 LAB — MULTIPLE MYELOMA PANEL, SERUM
ALBUMIN/GLOB SERPL: 1.1 (ref 0.7–1.7)
Albumin SerPl Elph-Mcnc: 3.9 g/dL (ref 2.9–4.4)
Alpha 1: 0.2 g/dL (ref 0.0–0.4)
Alpha2 Glob SerPl Elph-Mcnc: 0.5 g/dL (ref 0.4–1.0)
B-Globulin SerPl Elph-Mcnc: 1.6 g/dL — ABNORMAL HIGH (ref 0.7–1.3)
GAMMA GLOB SERPL ELPH-MCNC: 1.3 g/dL (ref 0.4–1.8)
GLOBULIN, TOTAL: 3.6 g/dL (ref 2.2–3.9)
IGA: 790 mg/dL — AB (ref 87–352)
IgG (Immunoglobin G), Serum: 1312 mg/dL (ref 700–1600)
IgM (Immunoglobulin M), Srm: 120 mg/dL (ref 26–217)
M Protein SerPl Elph-Mcnc: 0.4 g/dL — ABNORMAL HIGH
Total Protein ELP: 7.5 g/dL (ref 6.0–8.5)

## 2017-11-20 LAB — KAPPA/LAMBDA LIGHT CHAINS
KAPPA FREE LGHT CHN: 23 mg/L — AB (ref 3.3–19.4)
KAPPA, LAMDA LIGHT CHAIN RATIO: 0.14 — AB (ref 0.26–1.65)
Lambda free light chains: 165.7 mg/L — ABNORMAL HIGH (ref 5.7–26.3)

## 2017-11-26 ENCOUNTER — Telehealth: Payer: Self-pay | Admitting: Hematology and Oncology

## 2017-11-26 ENCOUNTER — Inpatient Hospital Stay (HOSPITAL_BASED_OUTPATIENT_CLINIC_OR_DEPARTMENT_OTHER): Payer: BLUE CROSS/BLUE SHIELD | Admitting: Hematology and Oncology

## 2017-11-26 ENCOUNTER — Encounter: Payer: Self-pay | Admitting: Hematology and Oncology

## 2017-11-26 DIAGNOSIS — D696 Thrombocytopenia, unspecified: Secondary | ICD-10-CM

## 2017-11-26 DIAGNOSIS — M0579 Rheumatoid arthritis with rheumatoid factor of multiple sites without organ or systems involvement: Secondary | ICD-10-CM | POA: Diagnosis not present

## 2017-11-26 DIAGNOSIS — D472 Monoclonal gammopathy: Secondary | ICD-10-CM | POA: Diagnosis not present

## 2017-11-26 NOTE — Assessment & Plan Note (Signed)
So far, she has no signs of clinical progression I briefly spent some time educating the patient natural history of MGUS. I plan to see her back in 12 months for repeat history, physical examination and blood work. I reinforced the importance of annual influenza vaccination but the patient declined

## 2017-11-26 NOTE — Assessment & Plan Note (Signed)
The cause is multi-factorial, likely related to autoimmune disorder, liver disease and splenomegaly. She is not symptomatic. Observe for now.

## 2017-11-26 NOTE — Progress Notes (Signed)
[facility] OFFICE PROGRESS NOTE  Patient Care Team: Red, [place] Team, MD as PCP - General (Family Medicine) Hennie Duos, MD as Consulting Physician (Rheumatology) Heath Lark, MD as Consulting Physician (Hematology and Oncology)  ASSESSMENT & PLAN:  MGUS (monoclonal gammopathy of unknown significance) So far, she has no signs of clinical progression I briefly spent some time educating the patient natural history of MGUS. I plan to see her back in 12 months for repeat history, physical examination and blood work. I reinforced the importance of annual influenza vaccination but the patient declined    Rheumatoid arthritis involving multiple sites with positive rheumatoid factor ([place]) She is undergoing evaluation and treatment by rheumatologists. Sometimes, rheumatoid arthritis can cause autoimmune thrombocytopenia and splenomegaly I will defer to them for further management. The disease is stable I reinforced importance of vitamin D supplement  Thrombocytopenia, unspecified ([place]) The cause is multi-factorial, likely related to autoimmune disorder, liver disease and splenomegaly. She is not symptomatic. Observe for now.    No orders of the defined types were placed in this encounter.   INTERVAL HISTORY: Please see below for problem oriented charting. She returns for further follow-up She denies recent infection, fever or chills She has received intermittent doses of prednisone therapy The patient denies any recent signs or symptoms of bleeding such as spontaneous epistaxis, hematuria or hematochezia. She has chronic stable joint pain from rheumatoid arthritis  SUMMARY OF ONCOLOGIC HISTORY:  [name] is here because of abnormal blood work. She has rheumatoid arthritis and is undergoing chronic treatment. She was treated with methotrexate in the past, over 15 years ago. She has been on intermittent doses of prednisone and prior treatment with Enbrel. Most  recently, she was started on Orencia. Her rheumatologist order SPEP which came back abnormal and hence she is referred here. The patient was under the impression she is being referred here because of abnormalities in the liver. On further review, she was noted to have abnormal liver function tests. She had imaging study done in her abdomen many years ago which show evidence of splenomegaly. She is not aware of prior diagnosis of hepatitis. On further review of her blood work, she is also noted to have thrombocytopenia.The patient denies any recent signs or symptoms of bleeding such as spontaneous epistaxis, hematuria or hematochezia. She has never received transfusions before.  She denies history of abnormal bone pain or bone fracture. Patient denies history of recurrent infection or atypical infections such as shingles of meningitis. Denies chills, night sweats, anorexia or abnormal weight loss. She had history of seizure disorder but has not taken any medications recently. 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, on observation  REVIEW OF SYSTEMS:   Constitutional: Denies fevers, chills or abnormal weight loss Eyes: Denies blurriness of vision Ears, nose, mouth, throat, and face: Denies mucositis or sore throat Respiratory: Denies cough, dyspnea or wheezes Cardiovascular: Denies palpitation, chest discomfort or lower extremity swelling Gastrointestinal:  Denies nausea, heartburn or change in bowel habits Skin: Denies abnormal skin rashes Lymphatics: Denies new lymphadenopathy or easy bruising Neurological:Denies numbness, tingling or new weaknesses Behavioral/Psych: Mood is stable, no new changes  All other systems were reviewed with the patient and are negative.  I have reviewed the past medical history, past surgical history, social history and family history with the patient and they are unchanged from previous  note.  ALLERGIES:  is allergic to celebrex [celecoxib] and  sulfur.  MEDICATIONS:  Current Outpatient Medications  Medication Sig Dispense Refill  . ORENCIA CLICKJECT 811 MG/ML SOAJ Inject 1 vial into the skin every 7 (seven) days. Once a week  1  . predniSONE (DELTASONE) 5 MG tablet Take 5 mg by mouth. Reported on 09/12/2015  0   No current facility-administered medications for this visit.     PHYSICAL EXAMINATION: ECOG PERFORMANCE STATUS: 1 - Symptomatic but completely ambulatory  Vitals:   11/26/17 1133  BP: 126/68  Pulse: 67  Resp: 18  Temp: 98 F (36.7 C)  SpO2: 99%   Filed Weights   11/26/17 1133  Weight: 157 lb 4.8 oz (71.4 kg)    GENERAL:alert, no distress and comfortable SKIN: skin color, texture, turgor are normal, no rashes or significant lesions EYES: normal, Conjunctiva are pink and non-injected, sclera clear OROPHARYNX:no exudate, no erythema and lips, buccal mucosa, and tongue normal  NECK: supple, thyroid normal size, non-tender, without nodularity LYMPH:  no palpable lymphadenopathy in the cervical, axillary or inguinal LUNGS: clear to auscultation and percussion with normal breathing effort HEART: regular rate & rhythm and no murmurs and no lower extremity edema ABDOMEN:abdomen soft, non-tender and normal bowel sounds Musculoskeletal:no cyanosis of digits and no clubbing.  She has classic rheumatoid nodules NEURO: alert & oriented x 3 with fluent speech, no focal motor/sensory deficits  LABORATORY DATA:  I have reviewed the data as listed    Component Value Date/Time   NA 140 11/19/2017 0937   NA 143 08/18/2015 1156   K 3.8 11/19/2017 0937   K 3.5 08/18/2015 1156   CL 105 11/19/2017 0937   CO2 26 11/19/2017 0937   CO2 26 08/18/2015 1156   GLUCOSE 128 (H) 11/19/2017 0937   GLUCOSE 107 08/18/2015 1156   BUN 15 11/19/2017 0937   BUN 12.4 08/18/2015 1156   CREATININE 0.95 11/19/2017 0937   CREATININE 0.9 08/18/2015 1156   CALCIUM 9.6 11/19/2017  0937   CALCIUM 9.1 08/18/2015 1156   PROT 7.9 11/19/2017 0937   PROT 6.7 11/20/2016 1356   PROT 7.7 08/18/2015 1156   ALBUMIN 4.3 11/19/2017 0937   ALBUMIN 4.0 08/18/2015 1156   AST 23 11/19/2017 0937   AST 66 (H) 08/18/2015 1156   ALT 43 11/19/2017 0937   ALT 117 (H) 08/18/2015 1156   ALKPHOS 79 11/19/2017 0937   ALKPHOS 70 08/18/2015 1156   BILITOT 1.0 11/19/2017 0937   BILITOT 1.00 08/18/2015 1156   GFRNONAA >60 11/19/2017 0937   GFRAA >60 11/19/2017 0937    No results found for: SPEP, UPEP  Lab Results  Component Value Date   WBC 5.3 11/19/2017   NEUTROABS 2.9 11/19/2017   HGB 15.1 11/19/2017   HCT 42.0 11/19/2017   MCV 91.1 11/19/2017   PLT 114 (L) 11/19/2017      Chemistry      Component Value Date/Time   NA 140 11/19/2017 0937   NA 143 08/18/2015 1156   K 3.8 11/19/2017 0937   K 3.5 08/18/2015 1156   CL 105 11/19/2017 0937   CO2 26 11/19/2017 0937   CO2 26 08/18/2015 1156   BUN 15 11/19/2017 0937   BUN 12.4 08/18/2015 1156   CREATININE 0.95 11/19/2017 0937   CREATININE 0.9 08/18/2015 1156      Component Value Date/Time   CALCIUM 9.6 11/19/2017 0937   CALCIUM 9.1 08/18/2015 1156   ALKPHOS 79 11/19/2017 0937   ALKPHOS 70 08/18/2015 1156   AST 23 11/19/2017 0937   AST  66 (H) 08/18/2015 1156   ALT 43 11/19/2017 0937   ALT 117 (H) 08/18/2015 1156   BILITOT 1.0 11/19/2017 0937   BILITOT 1.00 08/18/2015 1156       All questions were answered. The patient knows to call the clinic with any problems, questions or concerns. No barriers to learning was detected.  I spent 15 minutes counseling the patient face to face. The total time spent in the appointment was 20 minutes and more than 50% was on counseling and review of test results  Heath Lark, MD 11/26/2017 3:29 PM

## 2017-11-26 NOTE — Assessment & Plan Note (Signed)
She is undergoing evaluation and treatment by rheumatologists. Sometimes, rheumatoid arthritis can cause autoimmune thrombocytopenia and splenomegaly I will defer to them for further management. The disease is stable I reinforced importance of vitamin D supplement

## 2017-11-26 NOTE — Telephone Encounter (Signed)
Gave patient avs and calendar of upcoming July appts.  °

## 2018-01-15 ENCOUNTER — Emergency Department (HOSPITAL_COMMUNITY): Payer: BLUE CROSS/BLUE SHIELD

## 2018-01-15 ENCOUNTER — Emergency Department (HOSPITAL_COMMUNITY)
Admission: EM | Admit: 2018-01-15 | Discharge: 2018-01-16 | Disposition: A | Discharge status: Home / Self Care | Payer: BLUE CROSS/BLUE SHIELD | Attending: Emergency Medicine | Admitting: Emergency Medicine

## 2018-01-15 ENCOUNTER — Encounter (HOSPITAL_COMMUNITY): Payer: Self-pay

## 2018-01-15 ENCOUNTER — Other Ambulatory Visit: Payer: Self-pay

## 2018-01-15 DIAGNOSIS — M160 Bilateral primary osteoarthritis of hip: Secondary | ICD-10-CM | POA: Diagnosis not present

## 2018-01-15 DIAGNOSIS — R52 Pain, unspecified: Secondary | ICD-10-CM | POA: Diagnosis not present

## 2018-01-15 DIAGNOSIS — F17200 Nicotine dependence, unspecified, uncomplicated: Secondary | ICD-10-CM | POA: Diagnosis not present

## 2018-01-15 DIAGNOSIS — M25551 Pain in right hip: Secondary | ICD-10-CM

## 2018-01-15 DIAGNOSIS — H9042 Sensorineural hearing loss, unilateral, left ear, with unrestricted hearing on the contralateral side: Secondary | ICD-10-CM | POA: Diagnosis not present

## 2018-01-15 DIAGNOSIS — Z79899 Other long term (current) drug therapy: Secondary | ICD-10-CM | POA: Insufficient documentation

## 2018-01-15 DIAGNOSIS — R7989 Other specified abnormal findings of blood chemistry: Secondary | ICD-10-CM | POA: Diagnosis not present

## 2018-01-15 DIAGNOSIS — R569 Unspecified convulsions: Secondary | ICD-10-CM | POA: Diagnosis not present

## 2018-01-15 DIAGNOSIS — M25432 Effusion, left wrist: Secondary | ICD-10-CM | POA: Diagnosis not present

## 2018-01-15 DIAGNOSIS — M0589 Other rheumatoid arthritis with rheumatoid factor of multiple sites: Secondary | ICD-10-CM | POA: Diagnosis not present

## 2018-01-15 DIAGNOSIS — L608 Other nail disorders: Secondary | ICD-10-CM | POA: Diagnosis not present

## 2018-01-15 DIAGNOSIS — D472 Monoclonal gammopathy: Secondary | ICD-10-CM | POA: Diagnosis not present

## 2018-01-15 LAB — I-STAT BETA HCG BLOOD, ED (MC, WL, AP ONLY): I-stat hCG, quantitative: <5 m[IU]/mL (ref <5)

## 2018-01-15 MED ORDER — OXYCODONE-ACETAMINOPHEN 5-325 MG PO TABS
1.0000 | ORAL_TABLET | Freq: Once | ORAL | Status: AC
  Administered 2018-01-15: 1 via ORAL
  Filled 2018-01-15: qty 1

## 2018-01-15 NOTE — ED Triage Notes (Signed)
Arrived via [facility] for c/o right hip pain. States she has hx of RA and pain med not helping.

## 2018-01-15 NOTE — ED Notes (Signed)
Pt ambulate from the room to the bathroom and back to the room done well with-out help

## 2018-01-15 NOTE — ED Provider Notes (Signed)
[facility] DEPT Provider Note   CSN: [id number] Arrival date & time: 01/15/18  1958     History   Chief Complaint Chief Complaint  Patient presents with  . Hip Pain    HPI [name] is a 52 y.o. female with a history of rheumatoid arthritis presenting for right hip/groin pain that has been present since 3 AM this morning.  Patient denies injury or trauma to the area.  She states that the pain came on suddenly and was 10/10 in nature.  Patient states that she was seen at her today and received a cortisone shot in her right hip which only worsened her pain.  Patient states that she was also prescribed prescribed hydrocodone by her rheumatologist and she has taken 1 of these medications without relief before presenting to the emergency department.  Patient states that she has had similar pain in this area in the past, she states at that time she had fluid drained from her hip which greatly relieved her symptoms.  HPI  Past Medical History:  Diagnosis Date  . Ectopic pregnancy   . Elevated liver enzymes 08/18/2015  . Kidney stones    "passed them"  . MGUS (monoclonal gammopathy of unknown significance) 08/18/2015  . Rheumatoid arthritis ([place])   . Splenomegaly 08/18/2015  . Vitamin B12 deficiency 08/18/2015    Patient Active Problem List   Diagnosis Date Noted  . Elevated liver enzymes 08/18/2015  . MGUS (monoclonal gammopathy of unknown significance) 08/18/2015  . Splenomegaly 08/18/2015  . Vitamin B12 deficiency 08/18/2015  . 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 Convulsion ([place]) 12/13/2014  . Faintness 12/13/2014  . Seizure disorder ([place]) 11/25/2014  . Rash and nonspecific skin eruption 10/15/2013  . Allergic rhinitis 10/15/2013  . Ovarian mass 10/01/2013  . Rheumatoid arthritis involving multiple sites with positive rheumatoid factor ([place]) 09/08/2013  . TMJ (temporomandibular joint disorder) 09/08/2013  . Premature  ovarian failure 09/08/2013  . Thrombocytopenia, unspecified ([place]) 07/11/2013    Past Surgical History:  Procedure Laterality Date  . ECTOPIC PREGNANCY SURGERY  1990's  . HIP SURGERY Bilateral 2000's   "had fluid in them"  . LAPAROSCOPIC SALPINGO OOPHERECTOMY Left 12/30/2013   Procedure: LEFT SALPINGOOPHORECTOMY;  Surgeon: Lavonia Drafts, MD;  Location: [facility];  Service: Gynecology;  Laterality: Left;  BILATERAL SALPINGECTOMY  . LAPAROSCOPIC SALPINGO OOPHERECTOMY Right 2000  . SALPINGOOPHORECTOMY  1990's   "related to ectopic pregnancy"     OB History    Gravida  3   Para  1   Term  1   Preterm  0   AB  2   Living  1     SAB  0   TAB  1   Ectopic  1   Multiple  0   Live Births               Home Medications    Prior to Admission medications   Medication Sig Start Date End Date Taking? Authorizing Provider  HYDROcodone-acetaminophen (NORCO/VICODIN) 5-325 MG tablet Take 1 tablet by mouth every 6 (six) hours as needed for moderate pain or severe pain.   Yes [provider]  ORENCIA CLICKJECT 093 MG/ML SOAJ Inject 1 vial into the skin every 7 (seven) days. Once a week 04/27/15  Yes [provider]  predniSONE (DELTASONE) 5 MG tablet Take 5 mg by mouth daily as needed (flare up). Reported on 09/12/2015 04/18/15  Yes [provider]    Family History Family  History  Problem Relation Age of Onset  . Arthritis Mother   . Hypertension Mother   . Cancer Maternal Uncle        lung ca  . Cancer Maternal Grandmother        nose & brain cancer    Social History Social History   Tobacco Use  . Smoking status: Current Every Day Smoker    Packs/day: 0.25    Years: 25.00    Pack years: 6.25  . Smokeless tobacco: Never Used  Substance Use Topics  . Alcohol use: No    Alcohol/week: 0.0 standard drinks  . Drug use: Yes    Types: Marijuana    Comment: 1 jt per day at night     Allergies   Celebrex [celecoxib] and  Sulfur   Review of Systems Review of Systems  Constitutional: Negative.  Negative for chills and fever.  HENT: Negative.  Negative for rhinorrhea and sore throat.   Eyes: Negative.  Negative for visual disturbance.  Respiratory: Negative.  Negative for cough and shortness of breath.   Cardiovascular: Negative.  Negative for chest pain.  Gastrointestinal: Negative.  Negative for abdominal pain, blood in stool, diarrhea, nausea and vomiting.  Genitourinary: Negative.  Negative for dysuria and hematuria.  Musculoskeletal: Positive for arthralgias. Negative for back pain, joint swelling and myalgias.  Skin: Negative.  Negative for color change and rash.  Neurological: Negative.  Negative for dizziness, weakness, numbness and headaches.     Physical Exam Updated Vital Signs BP 132/62   Pulse 82   Temp 98.5 F (36.9 C) (Oral)   Resp 18   Ht 5' 4.5" (1.638 m)   Wt 74.8 kg   SpO2 95%   BMI 27.88 kg/m   Physical Exam  Constitutional: She is oriented to person, place, and time. She appears well-developed and well-nourished. No distress.  HENT:  Head: Normocephalic and atraumatic.  Right Ear: External ear normal.  Left Ear: External ear normal.  Nose: Nose normal.  Eyes: Pupils are equal, round, and reactive to light. EOM are normal.  Neck: Trachea normal and normal range of motion. No tracheal deviation present.  Cardiovascular: Normal rate, regular rhythm, normal heart sounds and intact distal pulses.  Pulses:      Dorsalis pedis pulses are 2+ on the right side, and 2+ on the left side.       Posterior tibial pulses are 2+ on the right side, and 2+ on the left side.  Pulmonary/Chest: Effort normal. No respiratory distress.  Abdominal: Soft. There is no tenderness. There is no rigidity, no rebound, no guarding, no CVA tenderness, no tenderness at McBurney's point and negative Murphy's sign.  Genitourinary:  Genitourinary Comments: Exam deferred by patient  Musculoskeletal:  Normal range of motion.       Right hip: She exhibits tenderness. She exhibits no bony tenderness, no swelling, no crepitus and no deformity.       Left hip: Normal.       Right knee: Normal.       Right ankle: Normal.       Lumbar back: Normal. She exhibits no tenderness, no bony tenderness, no swelling and no deformity.       Right upper leg: Normal.       Right lower leg: Normal.       Legs:      Right foot: Normal.  No sign of injury to right hip or groin. Tenderness to palpation to musculature of right glute and right  hip/upper quadricepts muscles.  No signs of injury, bruising, abrasion, laceration, swelling, erythema, increased warmth, fluctuance or induration.  Groin examination Chaperoned by Ernst Spell.  Feet:  Right Foot:  Protective Sensation: 3 sites tested. 3 sites sensed.  Left Foot:  Protective Sensation: 3 sites tested. 3 sites sensed.  Neurological: She is alert and oriented to person, place, and time. She has normal strength. No sensory deficit.  Mental Status: Alert, oriented, thought content appropriate, able to give a coherent history. Speech fluent without evidence of aphasia. Able to follow 2 step commands without difficulty. Cranial Nerves: II-XII: Grossly intact bilaterally Motor: Normal tone. 5/5 strength in upper and lower extremities bilaterally including strong and equal grip strength and dorsiflexion/plantar flexion Sensory: Sensation intact to light touch in all extremities. Cerebellar: Normal heel-to -shin balance bilaterally of the lower extremity. CV: distal pulses palpable throughout   Skin: Skin is warm and dry. Capillary refill takes less than 2 seconds. No erythema.  Psychiatric: She has a normal mood and affect. Her behavior is normal.     ED Treatments / Results  Labs (all labs ordered are listed, but only abnormal results are displayed) Labs Reviewed  I-STAT BETA HCG BLOOD, ED ([facility], [facility], [facility] ONLY)    EKG None  Radiology Dg Hips  Bilat W Or Wo Pelvis 5 Views  Result Date: 01/15/2018 CLINICAL DATA:  Rheumatoid arthritis EXAM: DG HIP (WITH OR WITHOUT PELVIS) 5+V BILAT COMPARISON:  None. FINDINGS: No acute fracture. No dislocation. Mild narrowing of the hip joint spaces bilaterally. IMPRESSION: No acute bony pathology. Electronically Signed   By: Marybelle Killings M.D.   On: 01/15/2018 21:24    Procedures Procedures (including critical care time)  Medications Ordered in ED Medications  oxyCODONE-acetaminophen (PERCOCET/ROXICET) 5-325 MG per tablet 1 tablet (1 tablet Oral Given 01/15/18 2133)     Initial Impression / Assessment and Plan / ED Course  I have reviewed the triage vital signs and the nursing notes.  Pertinent labs & imaging results that were available during my care of the patient were reviewed by me and considered in my medical decision making (see chart for details).  Clinical Course as of Jan 17 140  Wed Jan 15, 2018  2242 On reassessment patient is sleeping comfortably. Easily arousable. States pain is now 1/10 in severity, still painful with movement.   [BM]    Clinical Course User Index [BM] Deliah [place], PA-C   The patient was noted to have elevated BP in ED today. I have spoken with the patient regarding elevated blood pressure readings and the need for improved management. I instructed the patient to followup with their PCP within 1 week for BP check. I also counseled the patient regarding the signs and symptoms which would require an emergent visit to an emergency department for hypertensive urgency and/or hypertensive emergency.  Patient with history of rheumatoid arthritis presenting for acute onset right hip pain that began this morning.  Patient with cortisone injection today that made pain worse, has taken 1 hydrocodone without relief from her rheumatologist.  Patient given single Percocet here in emergency department with relief of symptoms.  Patient is ambulatory here emergency  department walking to bathroom and back without assistance or difficulty.  Patient still endorsing pain to the area.  There is no sign of infection or injury.  Patient is neurovascularly intact to bilateral lower extremities.  Patient encouraged to follow-up with her primary care doctor and her rheumatologist regarding her visit today.  Patient is  also been given orthopedic referral for further follow-up.  Patient states that she has hydrocodone at home prescribed by her rheumatologist that she can use for continued pain control. Patient is afebrile, not tachycardic.  At this time there does not appear to be any evidence of an acute emergency medical condition and the patient appears stable for discharge with appropriate outpatient follow up. Diagnosis was discussed with patient who verbalizes understanding of care plan and is agreeable to discharge. I have discussed return precautions with patient who verbalizes understanding of return precautions. Patient strongly encouraged to follow-up with their PCP. All questions answered.  Patient case discussed with and patient seen by Dr. Ashok Cordia who agrees with discharge and outpatient follow-up at this time.    Note: Portions of this report may have been transcribed using voice recognition software. Every effort was made to ensure accuracy; however, inadvertent computerized transcription errors may still be present.   Final Clinical Impressions(s) / ED Diagnoses   Final diagnoses:  Right hip pain    ED Discharge Orders    None       Gari Crown 01/16/18 0144    Lajean Saver, MD 01/16/18 1500

## 2018-01-15 NOTE — ED Notes (Signed)
Bed: WA23 Expected date:  Expected time:  Means of arrival:  Comments: EMS 

## 2018-01-15 NOTE — Discharge Instructions (Addendum)
Please return to the Emergency Department for any new or worsening symptoms or if your symptoms do not improve. Please be sure to follow up with your Primary Care Physician as soon as possible regarding your visit today. If you do not have a Primary Doctor please use the resources below to establish one. You may follow-up with the orthopedic doctor in your discharge paperwork for further evaluation. You may continue using the hydrocodone as prescribed by your rheumatoid doctor for severe pain.  Please do not drive or operate machinery while taking this medication. Your blood pressure was elevated today.  Please follow-up with your primary care provider regarding this as well.  Please take all of your blood pressure medication as prescribed by your primary care doctor.  Contact a health care provider if: Your pain increases, and medicine does not help. Your joint pain does not improve within 3 days. You have increased bruising or swelling or warmth or redness to the joint You have a fever. You lose 10 lb (4.5 kg) or more without trying. Get help right away if: You are not able to move the joint. Your fingers or toes become numb or they turn cold and blue. Contact a health care provider if: You cannot put weight on your leg. Your pain or swelling continues or gets worse after one week. It gets harder to walk. You have a fever. Get help right away if: You fall. You have a sudden increase in pain and swelling in your hip. Your hip is red or swollen or very tender to touch.  RESOURCE GUIDE  Chronic Pain Problems: Contact [facility]  [phone] Patients need to be referred by their primary care doctor.  Insufficient Money for Medicine: Contact [employer]:  call "211" or [place] [phone].  No Primary Care Doctor: Call [facility]  [phone] - can help you locate a primary care doctor that  accepts your insurance, provides certain services,  etc. Physician Referral Service- [phone]  Agencies that provide inexpensive medical care: [facility]  [facility]  [phone] [facility]  [phone] [facility]  [phone] [facility]  [phone] [facility]  [phone]  [place] Providers: [facility]- [address], [address]  [phone], Mon-Fri 9am-7pm, Sat 9am-1pm [place], [address] [place]  [place], [address] [place]  [place]- [address]  [place], [address], [phone]  Only accepts [place] patients after they have their name  applied to their card  Self Pay (no insurance) in [facility]: Sickle Cell Patients: Dr Kevan Ny, [facility]  [place], [facility]- [place]  [facility]- [address], [facility]- see information above (Speak to [employer] if you do not have insurance)       -  [facility]- [place], [place]- [place],  [place], [place]       -  Dr Vista Lawman-  [address], [address], [place], [facility]- [address], [phone]       -  [facility]- [address], [place], also [address] [phone]       -    [facility]- [address], [place], 1st & 3rd Saturday   every month, 10am-1pm  1) Find a Doctor and Pay Out of Pocket Although you won't have to find out who is covered by your insurance plan, it is a good idea to ask around and get recommendations. You will then need to call the  office and see if the doctor you have chosen will accept you as a new patient and what types of options they offer for patients who are self-pay. Some doctors offer discounts or will set up payment plans for their patients who do not have insurance, but you will need to ask so you aren't surprised when you get to your appointment.  2) Contact Your Local Health Department Not all health departments have doctors that can see patients for sick visits, but many do, so it is worth a call to see if yours does. If you don't know where your local health department is, you can check in your phone book. The CDC also has a tool to help you locate your state's health department, and many state websites also have listings of all of their local health departments.  3) Find a [place] [facility] If your illness is not likely to be very severe or complicated, you may want to try a walk in clinic. These are popping up all over the country in pharmacies, drugstores, and shopping centers. They're usually staffed by nurse practitioners or physician assistants that have been trained to treat common illnesses and complaints. They're usually fairly quick and inexpensive. However, if you have serious medical issues or chronic medical problems, these are probably not your best option  [facility] [place], [facility], [address], [place], phone [phone] or [phone].  Monday - Friday, call for an appointment. [place], [facility], [address], [place], phone [phone] or [phone].  Monday - Friday, call for an appointment.  Abuse/Neglect: [place] [phone] [place] [phone] (After Hours)  Emergency Shelter:  [facility] [phone]  Maternity Homes: Room at the [place] [phone] [place] [phone]  MRSA Hotline #:   (424)597-7219  [facility] Dept. [address]         [place] Phone:  [phone]  Phone:  [phone]                   Phone:  [place], [place]- [phone]       -     [facility] in [place], [address],                                  [place] [phone] or [phone] (After Hours)   [place]  Substance Abuse Resources: [facility] and Drug Services  [phone] [place] [phone] [facility] Chinita Pester 7184048608 Residential & Outpatient Substance Abuse Program  4634431883  Psychological Services: [place]  [phone] [place]  [place], [place]. [address] [place], [place]: [phone] or [phone], [employer]  Dental Assistance  If unable to pay or uninsured, contact:  [facility] or [facility]. to become qualified for the adult dental clinic.  Patients with Medicaid: [facility] [phone], [place] [address], [phone]  If unable to pay, or uninsured, contact [facility] [phone]) or [place] [phone] in [place], [place] in [facility]) to become qualified for the adult dental clinic   Other [place]- [place], [place], [place], [address], [place], [place], 2nd and 4th Thursday of the month  at 6:30am.  10 clients each day by appointment, can sometimes see walk-in patients if someone does not show for an appointment. [facility]- [address] Hillard Danker [place], [place], [address], [place], [place], [place], [address], [place] Department- [phone] [place] [facility] [phone]

## 2018-01-29 DIAGNOSIS — M25551 Pain in right hip: Secondary | ICD-10-CM | POA: Diagnosis not present

## 2018-03-26 ENCOUNTER — Ambulatory Visit (INDEPENDENT_AMBULATORY_CARE_PROVIDER_SITE_OTHER): Payer: BLUE CROSS/BLUE SHIELD | Admitting: Family Medicine

## 2018-03-26 ENCOUNTER — Ambulatory Visit
Admission: RE | Admit: 2018-03-26 | Discharge: 2018-03-26 | Disposition: A | Discharge status: Home / Self Care | Payer: BLUE CROSS/BLUE SHIELD | Source: Ambulatory Visit | Attending: Family Medicine | Admitting: Family Medicine

## 2018-03-26 ENCOUNTER — Encounter: Payer: Self-pay | Admitting: Family Medicine

## 2018-03-26 ENCOUNTER — Telehealth: Payer: Self-pay

## 2018-03-26 ENCOUNTER — Other Ambulatory Visit: Payer: Self-pay

## 2018-03-26 VITALS — BP 158/80 | HR 75 | Temp 98.3°F | Wt 157.6 lb

## 2018-03-26 DIAGNOSIS — R0609 Other forms of dyspnea: Principal | ICD-10-CM

## 2018-03-26 DIAGNOSIS — R05 Cough: Secondary | ICD-10-CM | POA: Diagnosis not present

## 2018-03-26 DIAGNOSIS — J441 Chronic obstructive pulmonary disease with (acute) exacerbation: Secondary | ICD-10-CM

## 2018-03-26 DIAGNOSIS — R0600 Dyspnea, unspecified: Secondary | ICD-10-CM

## 2018-03-26 DIAGNOSIS — Z13220 Encounter for screening for lipoid disorders: Secondary | ICD-10-CM | POA: Diagnosis not present

## 2018-03-26 MED ORDER — PREDNISONE 20 MG PO TABS: 40.0000 mg | ORAL_TABLET | Freq: Every day | ORAL | 0 refills | Status: AC

## 2018-03-26 MED ORDER — AZITHROMYCIN 250 MG PO TABS: ORAL_TABLET | ORAL | 0 refills | Status: DC

## 2018-03-26 NOTE — Progress Notes (Signed)
  Patient Name: [name] Date of Birth: [birth year] Date of Visit: 03/26/18 PCP: Martyn Malay, MD  Chief Complaint: cough   Subjective: [name] is a pleasant 52 y.o. year old woman with history of cannabis use, MGUS, and rheumatoid arthritis presenting today with concerns for cough.  She reports 3 weeks of illness.  She reports this started as an upper respiratory infection with nasal congestion and a cough.  This progressed the point that she is dyspneic on exertion with productive sputum.  She normally does not have a chronic cough or sputum production.  She does smoke marijuana every day.  She is done this for many years.  She has never used cigarettes, this is previously documented in her chart and has been removed.  She denies fevers, weight loss, nausea, vomiting, chest pain, pleuritic type chest pain.  She reports she feels as though she is wheezing when she exerts herself.  She has no family history of chronic obstructive pulmonary disease.  There is no family history of early coronary artery disease or heart failure.  She denies lower extremity swelling, weight gain, orthopnea, paroxysmal nocturnal dyspnea.    ROS:  ROS As above  I have reviewed the patient's medical, surgical, family, and social history as appropriate.   Vitals:   03/26/18 1049  BP: (!) 158/80  Pulse: 75  Temp: 98.3 F (36.8 C)  SpO2: 98%   Filed Weights   03/26/18 1049  Weight: 157 lb 9.6 oz (71.5 kg)  HEENT: Sclera anicteric. Dentition is poor. Appears well hydrated. Neck: Supple Cardiac: Regular rate and rhythm. Normal S1/S2. No murmurs, rubs, or gallops appreciated. Lungs:Tachypneic with exertion, rhonchi throughout left upper lobe with egophony, no wheezing on expiration  Abdomen: Normoactive bowel sounds. No tenderness to deep or light palpation. No rebound or guarding.  Extremities: Warm, well perfused without edema.  Skin: Warm, dry Psych: Pleasant and appropriate     Dylin was seen today for uri.  Diagnoses and all orders for this visit:  Screening for cholesterol level -     Lipid Panel  Dyspnea on exertion, possibly due to CAP vs. COPD exacerbation. No spirometry confirmed COPD but given long standing smoking of cannabis and symptoms, possible contributing factor. No signs of influenza. Will plan to treat based upon impaging results with either prednisone + azithromycin or CAP therapy.  -     DG Chest 2 View; Future -     CBC with Differential -     Basic Metabolic Panel  Attempted to call patient at 3 PM. Will plan to treat as COPD exacerbation based upon preliminary x-ray results.  Dorris Singh, MD  Family Medicine Teaching Service

## 2018-03-26 NOTE — Patient Instructions (Addendum)
It was wonderful to see you today.  Thank you for choosing [facility].   Please call [phone] with any questions about today's appointment.  Please be sure to schedule follow up at the front  desk before you leave today.   Dorris Singh, MD  Family Medicine    Please go to get your x-ray today    I will call you later today

## 2018-03-26 NOTE — Telephone Encounter (Signed)
Called patient to discuss results. Final read concurs with preliminary read- normal x-ray. Patient has longstanding smoke exposure, will treat as COPD exacerbation. Patient has not taken abatacept (T cell inhibitor) in 3 weeks.  PFTs at follow up. Discussed cessation of cannabis. Reviewed reasons to call and return to care. Strict return precautions for ED given (patient has history of RA, immunosuppression) including fever, worsening dyspnea.  All questions answered.

## 2018-03-26 NOTE — Telephone Encounter (Signed)
Patient returning call to PCP regarding xray results.  Call back is [phone]  Danley Danker, RN [facility] [place])

## 2018-03-27 LAB — CBC WITH DIFFERENTIAL/PLATELET
BASOS ABS: 0.1 10*3/uL (ref 0.0–0.2)
BASOS: 1 %
EOS (ABSOLUTE): 0.4 10*3/uL (ref 0.0–0.4)
Eos: 6 %
Hematocrit: 43 % (ref 34.0–46.6)
Hemoglobin: 15 g/dL (ref 11.1–15.9)
Immature Grans (Abs): 0 10*3/uL (ref 0.0–0.1)
Immature Granulocytes: 0 %
Lymphocytes Absolute: 3.1 10*3/uL (ref 0.7–3.1)
Lymphs: 41 %
MCH: 32 pg (ref 26.6–33.0)
MCHC: 34.9 g/dL (ref 31.5–35.7)
MCV: 92 fL (ref 79–97)
MONOS ABS: 0.5 10*3/uL (ref 0.1–0.9)
Monocytes: 7 %
NEUTROS ABS: 3.5 10*3/uL (ref 1.4–7.0)
NEUTROS PCT: 45 %
Platelets: 158 10*3/uL (ref 150–450)
RBC: 4.69 x10E6/uL (ref 3.77–5.28)
RDW: 13.1 % (ref 12.3–15.4)
WBC: 7.6 10*3/uL (ref 3.4–10.8)

## 2018-03-27 LAB — BASIC METABOLIC PANEL
BUN / CREAT RATIO: 14 (ref 9–23)
BUN: 11 mg/dL (ref 6–24)
CO2: 22 mmol/L (ref 20–29)
Calcium: 9.5 mg/dL (ref 8.7–10.2)
Chloride: 105 mmol/L (ref 96–106)
Creatinine, Ser: 0.8 mg/dL (ref 0.57–1.00)
GFR calc non Af Amer: 85 mL/min/{1.73_m2} (ref >59)
GFR, EST AFRICAN AMERICAN: 98 mL/min/{1.73_m2} (ref >59)
GLUCOSE: 114 mg/dL — AB (ref 65–99)
POTASSIUM: 3.4 mmol/L — AB (ref 3.5–5.2)
SODIUM: 143 mmol/L (ref 134–144)

## 2018-03-27 LAB — LIPID PANEL
Chol/HDL Ratio: 4.4 ratio (ref 0.0–4.4)
Cholesterol, Total: 200 mg/dL — ABNORMAL HIGH (ref 100–199)
HDL: 45 mg/dL (ref >39)
LDL Calculated: 126 mg/dL — ABNORMAL HIGH (ref 0–99)
Triglycerides: 143 mg/dL (ref 0–149)
VLDL Cholesterol Cal: 29 mg/dL (ref 5–40)

## 2018-04-17 DIAGNOSIS — E663 Overweight: Secondary | ICD-10-CM | POA: Diagnosis not present

## 2018-04-17 DIAGNOSIS — J069 Acute upper respiratory infection, unspecified: Secondary | ICD-10-CM | POA: Diagnosis not present

## 2018-04-17 DIAGNOSIS — R569 Unspecified convulsions: Secondary | ICD-10-CM | POA: Diagnosis not present

## 2018-04-17 DIAGNOSIS — H9042 Sensorineural hearing loss, unilateral, left ear, with unrestricted hearing on the contralateral side: Secondary | ICD-10-CM | POA: Diagnosis not present

## 2018-04-17 DIAGNOSIS — Z6826 Body mass index (BMI) 26.0-26.9, adult: Secondary | ICD-10-CM | POA: Diagnosis not present

## 2018-04-17 DIAGNOSIS — M25432 Effusion, left wrist: Secondary | ICD-10-CM | POA: Diagnosis not present

## 2018-04-17 DIAGNOSIS — L608 Other nail disorders: Secondary | ICD-10-CM | POA: Diagnosis not present

## 2018-04-17 DIAGNOSIS — R7989 Other specified abnormal findings of blood chemistry: Secondary | ICD-10-CM | POA: Diagnosis not present

## 2018-04-17 DIAGNOSIS — Z79899 Other long term (current) drug therapy: Secondary | ICD-10-CM | POA: Diagnosis not present

## 2018-04-17 DIAGNOSIS — D472 Monoclonal gammopathy: Secondary | ICD-10-CM | POA: Diagnosis not present

## 2018-04-17 DIAGNOSIS — M0589 Other rheumatoid arthritis with rheumatoid factor of multiple sites: Secondary | ICD-10-CM | POA: Diagnosis not present

## 2018-04-27 ENCOUNTER — Encounter: Payer: Self-pay | Admitting: Family Medicine

## 2018-04-28 ENCOUNTER — Other Ambulatory Visit: Payer: Self-pay

## 2018-04-28 ENCOUNTER — Encounter: Payer: Self-pay | Admitting: Family Medicine

## 2018-04-28 ENCOUNTER — Ambulatory Visit (INDEPENDENT_AMBULATORY_CARE_PROVIDER_SITE_OTHER): Payer: BLUE CROSS/BLUE SHIELD | Admitting: Family Medicine

## 2018-04-28 VITALS — BP 116/60 | HR 68 | Temp 98.0°F | Ht 64.5 in | Wt 160.0 lb

## 2018-04-28 DIAGNOSIS — Z8639 Personal history of other endocrine, nutritional and metabolic disease: Secondary | ICD-10-CM | POA: Diagnosis not present

## 2018-04-28 DIAGNOSIS — Z114 Encounter for screening for human immunodeficiency virus [HIV]: Secondary | ICD-10-CM

## 2018-04-28 DIAGNOSIS — R739 Hyperglycemia, unspecified: Secondary | ICD-10-CM | POA: Diagnosis not present

## 2018-04-28 DIAGNOSIS — R062 Wheezing: Secondary | ICD-10-CM

## 2018-04-28 DIAGNOSIS — Z1159 Encounter for screening for other viral diseases: Secondary | ICD-10-CM | POA: Diagnosis not present

## 2018-04-28 DIAGNOSIS — J309 Allergic rhinitis, unspecified: Secondary | ICD-10-CM

## 2018-04-28 LAB — POCT GLYCOSYLATED HEMOGLOBIN (HGB A1C): HEMOGLOBIN A1C: 5 % (ref 4.0–5.6)

## 2018-04-28 MED ORDER — FLUTICASONE PROPIONATE 50 MCG/ACT NA SUSP: 2.0000 | Freq: Every day | NASAL | 6 refills | Status: DC

## 2018-04-28 MED ORDER — ALBUTEROL SULFATE HFA 108 (90 BASE) MCG/ACT IN AERS: 2.0000 | INHALATION_SPRAY | Freq: Four times a day (QID) | RESPIRATORY_TRACT | 0 refills | Status: DC | PRN

## 2018-04-28 NOTE — Patient Instructions (Addendum)
It was wonderful to see you today.  Thank you for choosing [facility].   Please call [phone] with any questions about today's appointment.  Please be sure to schedule follow up at the front  desk before you leave today.   Dorris Singh, MD  Family Medicine   Please schedule follow up with Dr. Valentina Lucks for lung testing (spirometry)---Dr. Valentina Lucks is our pharmacist and performs lung function testing.   Schedule follow up with me in 2-3 months

## 2018-04-28 NOTE — Progress Notes (Signed)
  Patient Name: [name] Date of Birth: 02/12/1966 Date of Visit: 04/28/18 PCP: Martyn Malay, MD  Chief Complaint: follow up from breathing   Subjective: [name] is a pleasant 52 y.o. year old woman with history significant for rheumatoid arthritis, seizure disorder, and MGUS presenting today with nasal congestion and a mild left-sided maxillary headache.  The patient reports her cough and wheezing improved after therapy with azithromycin and prednisone. She reports a 2-3 day history of left sided nasal 'stuffiness', congestion, and mild cough. Denies fevers, chills, wheezing, difficulty breathing, nausea, or vomiting. She has tried humidified air and Claritin for her symptoms.   The patient reports no more episodes of wheezing, cough, or dyspnea. She does report a nocturnal cough. No LE edema, orthopnea, PND.   In terms of her mammogram, the patient will consider at follow up. She is possibly interested in a colonoscopy-wishes to think about this more. She reports she was previously tested for HIV/Hep C.    The patient has a history of elevated LFT---now normalized. Her elastography showed possible fibrosis.   The patient has a documented history of B12 deficiency. She reports she is supposed to be taking oral B12--she has not taken in a year or more. She eats meat regularly and other animal products.   ROS:  ROS  As above.   I have reviewed the patient's medical, surgical, family, and social history as appropriate.   Vitals:   04/28/18 0832  BP: 116/60  Pulse: 68  Temp: 98 F (36.7 C)  SpO2: 98%   Filed Weights   04/28/18 0832  Weight: 160 lb (72.6 kg)    HEENT: Sclera anicteric. Dentition is poor. Appears well hydrated. Neck: Supple Cardiac: Regular rate and rhythm. Normal S1/S2. No murmurs, rubs, or gallops appreciated. Lungs: Clear bilaterally to ascultation.  Skin: Warm, dry Psych: Pleasant and appropriate   [name] was seen today for  follow-up.  Diagnoses and all orders for this visit:  Hyperglycemia, noted on BMET, no signs or symptoms.  -     HgB A1c  History of non anemic vitamin B12 deficiency -     Vitamin B12  Wheezing, possibly due to underlying asthma or obstructive lung disease. Must also consider restrictive pathology given RA. She will follow up with Dr. Valentina Lucks for spirometry. If lung symptoms continue or worsen, consider echocardiogram and Pulmonary referral.  -     albuterol (PROVENTIL HFA;VENTOLIN HFA) 108 (90 Base) MCG/ACT inhaler; Inhale 2 puffs into the lungs every 6 (six) hours as needed for wheezing or shortness of breath.  Allergic sinusitis, no signs suggestive of bacterial infection, gave strict return precautions, if not improved by end of week, instructed to call.  -     fluticasone (FLONASE) 50 MCG/ACT nasal spray; Place 2 sprays into both nostrils daily.  HCM Patient declined influenza shot (amenable to continued discussion), mammogram, and CSY today, she would like to discuss at follow up.  Needs Pap smear at follow up   Also at follow up- discuss referral back to Neurology and Gastroenterology referral for steatosis.  Dorris Singh, MD  Family Medicine Teaching Service

## 2018-04-29 LAB — VITAMIN B12: Vitamin B-12: 394 pg/mL (ref 232–1245)

## 2018-04-30 ENCOUNTER — Encounter: Payer: Self-pay | Admitting: Family Medicine

## 2018-09-26 ENCOUNTER — Other Ambulatory Visit: Payer: Self-pay

## 2018-09-26 ENCOUNTER — Telehealth (INDEPENDENT_AMBULATORY_CARE_PROVIDER_SITE_OTHER): Payer: BLUE CROSS/BLUE SHIELD | Admitting: Family Medicine

## 2018-09-26 DIAGNOSIS — R1013 Epigastric pain: Secondary | ICD-10-CM | POA: Diagnosis not present

## 2018-09-26 NOTE — Assessment & Plan Note (Signed)
Patient reports 2 days of abdominal pain mostly epigastric area described as pressure.  Patient has some mild nausea with one episode of vomiting after trying to take ibuprofen on empty stomach.  Patient also reports some diarrhea.  Given acuity of symptoms most concerning for food poisoning after eating at fast food restaurant 2 days ago.  Minimal concern for atypical presentation of cardiac event patient denies any shortness of breath, chest pain, numbness or tingling however patient really or dizziness.  Differential also includes GERD and possible gastritis secondary to NSAIDs.  Patient declines Zofran for nausea reporting that he has been very mild and she has only vomited once.  Will manage conservatively with bland diet for the next 2 days if symptoms do not improve by Monday patient will follow-up.

## 2018-09-26 NOTE — Progress Notes (Signed)
[place] [place] Telemedicine Visit  Patient consented to have virtual visit. Method of visit: Telephone  Encounter participants: Patient: [name] - located at Home Provider: Marjie Skiff - located at [facility] Others (if applicable): None   Chief Complaint: Abdominal pain  HPI: Patient is a 53 year old female who is coming in today for abdominal pain.  Patient reports that 2 days ago she had ham cheese croissant sandwich from [employer].  Since then she has had abdominal pain and has been unable to eat.  She has taken ibuprofen last night but vomited right away.  Patient described the pain as feeling pressure in her belly.  Patient also endorsed some diarrhea and mild nausea.  She denies any chest pain, shortness of breath, dizziness, numbness or tingling down her arms, burning in her chest or bad taste in her mouth.  ROS: per HPI  Pertinent PMHx: MGUS, Rheumatoid Arthritis  Exam:  Cardiac: No palpitations, no chest pain Respiratory: No increased work of breathing Abdominal: Epigastric pressure Neuro: Alert and oriented x4, no focal deficit  Assessment/Plan:  Epigastric pain Patient reports 2 days of abdominal pain mostly epigastric area described as pressure.  Patient has some mild nausea with one episode of vomiting after trying to take ibuprofen on empty stomach.  Patient also reports some diarrhea.  Given acuity of symptoms most concerning for food poisoning after eating at fast food restaurant 2 days ago.  Minimal concern for atypical presentation of cardiac event patient denies any shortness of breath, chest pain, numbness or tingling however patient really or dizziness.  Differential also includes GERD and possible gastritis secondary to NSAIDs.  Patient declines Zofran for nausea reporting that he has been very mild and she has only vomited once.  Will manage conservatively with bland diet for the next 2 days if symptoms do not improve by Monday  patient will follow-up.     Time spent during visit with patient: 5 minutes

## 2018-09-29 ENCOUNTER — Ambulatory Visit (INDEPENDENT_AMBULATORY_CARE_PROVIDER_SITE_OTHER): Payer: BLUE CROSS/BLUE SHIELD | Admitting: Family Medicine

## 2018-09-29 ENCOUNTER — Other Ambulatory Visit: Payer: Self-pay

## 2018-09-29 VITALS — BP 122/80 | HR 103

## 2018-09-29 DIAGNOSIS — M2669 Other specified disorders of temporomandibular joint: Secondary | ICD-10-CM

## 2018-09-29 DIAGNOSIS — M26649 Arthritis of unspecified temporomandibular joint: Secondary | ICD-10-CM

## 2018-09-29 MED ORDER — NAPROXEN 500 MG PO TABS: 500.0000 mg | ORAL_TABLET | Freq: Two times a day (BID) | ORAL | 0 refills | Status: AC

## 2018-09-29 MED ORDER — CYCLOBENZAPRINE HCL 10 MG PO TABS: 10.0000 mg | ORAL_TABLET | Freq: Every day | ORAL | 0 refills | Status: AC

## 2018-09-29 NOTE — Assessment & Plan Note (Signed)
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 Mastoiditis was also considered but no signs of infection (fever, chills) and no mastoid tenderness. Also, right ear TM was had no significant fluid and was non-swollen, no pus in inner ear seen. - Naproxen 500mg  BID for 14 days - Flexeril 10mg  QHS for 14 days - Given home exercises for TMJ to begin once pain is under control to help prevent reoccurrence.

## 2018-09-29 NOTE — Patient Instructions (Addendum)
It was great to meet you today! Thank you for letting me participate in your care!  Today, we discussed your recent episode of jaw and ear pain. 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 More information about it is below.   I have sent two prescriptions to your pharmacy. Please pick them up and start taking them today. Please also do the exercises as this is very important in preventing this from reoccurring in the future.  Please return to the clinic if your symptoms worsen or if you don't get start to get better in 3 days.   Temporomandibular Joint Syndrome  Temporomandibular joint syndrome (TMJ syndrome) is a condition that causes pain in the temporomandibular joints. These joints are located near your ears and allow your jaw to open and close. For people with TMJ syndrome, chewing, biting, or other movements of the jaw can be difficult or painful. TMJ syndrome is often mild and goes away within a few weeks. However, sometimes the condition becomes a long-term (chronic) problem. What are the causes? This condition may be caused by:  Grinding your teeth or clenching your jaw. Some people do this when they are under stress.  Arthritis.  Injury to the jaw.  Head or neck injury.  Teeth or dentures that are not aligned well. In some cases, the cause of TMJ syndrome may not be known. What are the signs or symptoms? The most common symptom of this condition is an aching pain on the side of the head in the area of the TMJ. Other symptoms may include:  Pain when moving your jaw, such as when chewing or biting.  Being unable to open your jaw all the way.  Making a clicking sound when you open your mouth.  Headache.  Earache.  Neck or shoulder pain. How is this diagnosed? This condition may be diagnosed based on:  Your symptoms and medical history.  A physical exam. Your health care provider may check the range of motion of your jaw.  Imaging tests, such as X-rays  or an MRI. You may also need to see your dentist, who will determine if your teeth and jaw are lined up correctly. How is this treated? TMJ syndrome often goes away on its own. If treatment is needed, the options may include:  Eating soft foods and applying ice or heat.  Medicines to relieve pain or inflammation.  Medicines or massage to relax the muscles.  A splint, bite plate, or mouthpiece to prevent teeth grinding or jaw clenching.  Relaxation techniques or counseling to help reduce stress.  A therapy for pain in which an electrical current is applied to the nerves through the skin (transcutaneous electrical nerve stimulation).  Acupuncture. This is sometimes helpful to relieve pain.  Jaw surgery. This is rarely needed. Follow these instructions at home:  Eating and drinking  Eat a soft diet if you are having trouble chewing.  Avoid foods that require a lot of chewing. Do not chew gum. General instructions  Take over-the-counter and prescription medicines only as told by your health care provider.  If directed, put ice on the painful area. ? Put ice in a plastic bag. ? Place a towel between your skin and the bag. ? Leave the ice on for 20 minutes, 2-3 times a day.  Apply a warm, wet cloth (warm compress) to the painful area as directed.  Massage your jaw area and do any jaw stretching exercises as told by your health care provider.  If you were given a splint, bite plate, or mouthpiece, wear it as told by your health care provider.  Keep all follow-up visits as told by your health care provider. This is important. Contact a health care provider if:  You are having trouble eating.  You have new or worsening symptoms. Get help right away if:  Your jaw locks open or closed. Summary  Temporomandibular joint syndrome (TMJ syndrome) is a condition that causes pain in the temporomandibular joints. These joints are located near your ears and allow your jaw to open and  close.  TMJ syndrome is often mild and goes away within a few weeks. However, sometimes the condition becomes a long-term (chronic) problem.  Symptoms include an aching pain on the side of the head in the area of the TMJ, pain when chewing or biting, and being unable to open your jaw all the way. You may also make a clicking sound when you open your mouth.  TMJ syndrome often goes away on its own. If treatment is needed, it may include medicines to relieve pain, reduce inflammation, or relax the muscles. A splint, bite plate, or mouthpiece may also be used to prevent teeth grinding or jaw clenching. This information is not intended to replace advice given to you by your health care provider. Make sure you discuss any questions you have with your health care provider. Document Released: 01/23/2001 Document Revised: 06/11/2017 Document Reviewed: 06/11/2017 Elsevier Interactive Patient Education  2019 [place], Harolyn Rutherford, DO PGY-2, [facility]

## 2018-09-29 NOTE — Progress Notes (Signed)
     Subjective: Chief Complaint  Patient presents with  . Ear Pain     HPI: [name] is a 53 y.o. presenting to clinic today to discuss the following:  Jaw and Ear Pain Patient states she started having pain located behind her ear and in her jaw on the right side only yesterday morning. She thought it may have been due to "sleepy funny" as she had a little bit of neck pain. However, her pain described as constant, sharp, rated at a 7/10 that is non-radiating did not get better. No associated hearing loss or changes in vision. She took some OTC ear drops that did not help. The pain is worse with opening her jaw, eating, etc. She states nothing has made it better.  She denies fever, rhinorrhea, cough, sneezing, sore throat, SOB, CP, nausea, vomiting, abdominal pain.    ROS noted in HPI.   Past Medical, Surgical, Social, and Family History Reviewed & Updated per EMR.   Pertinent Historical Findings include:   Social History   Tobacco Use  Smoking Status Never Smoker  Smokeless Tobacco Never Used   Objective: BP 122/80   Pulse (!) 103   SpO2 98%  Vitals and nursing notes reviewed  Physical Exam Gen: Alert and Oriented x 3, NAD HEENT: Normocephalic, atraumatic, PERRLA, EOMI, TM visible with good light reflex, non-swollen, non-erythematous turbinates, non-erythematous pharyngeal mucosa, no exudates; TTP at the TMJ with clicking and popping on jaw extension. No TTP at the mastoid process. 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 Mastoiditis was also considered but no signs  of infection (fever, chills) and no mastoid tenderness. Also, right ear TM was had no significant fluid and was non-swollen, no pus in inner ear seen. - Naproxen 500mg  BID for 14 days - Flexeril 10mg  QHS for 14 days - Given home exercises for TMJ to begin once pain is under control to help prevent reoccurrence.   PATIENT EDUCATION PROVIDED: See AVS    Diagnosis and plan along with any newly prescribed medication(s) were discussed in detail with this patient today. The patient verbalized understanding and agreed with the plan. Patient advised if symptoms worsen return to clinic or ER.    Meds ordered this encounter  Medications  . naproxen (NAPROSYN) 500 MG tablet    Sig: Take 1 tablet (500 mg total) by mouth 2 (two) times daily with a meal for 15 days.    Dispense:  30 tablet    Refill:  0  . cyclobenzaprine (FLEXERIL) 10 MG tablet    Sig: Take 1 tablet (10 mg total) by mouth at bedtime for 15 days.    Dispense:  15 tablet    Refill:  0     Tim Garlan Fillers, DO 09/29/2018, 11:06 AM PGY-2 [facility]

## 2018-10-15 DIAGNOSIS — R7989 Other specified abnormal findings of blood chemistry: Secondary | ICD-10-CM | POA: Diagnosis not present

## 2018-10-15 DIAGNOSIS — M25432 Effusion, left wrist: Secondary | ICD-10-CM | POA: Diagnosis not present

## 2018-10-15 DIAGNOSIS — M0589 Other rheumatoid arthritis with rheumatoid factor of multiple sites: Secondary | ICD-10-CM | POA: Diagnosis not present

## 2018-10-15 DIAGNOSIS — R569 Unspecified convulsions: Secondary | ICD-10-CM | POA: Diagnosis not present

## 2018-11-17 ENCOUNTER — Other Ambulatory Visit: Payer: Self-pay | Admitting: Hematology and Oncology

## 2018-11-17 DIAGNOSIS — D472 Monoclonal gammopathy: Secondary | ICD-10-CM

## 2018-11-18 ENCOUNTER — Other Ambulatory Visit: Payer: Self-pay

## 2018-11-18 ENCOUNTER — Inpatient Hospital Stay: Payer: BC Managed Care – PPO | Attending: Hematology and Oncology

## 2018-11-18 DIAGNOSIS — D472 Monoclonal gammopathy: Secondary | ICD-10-CM | POA: Insufficient documentation

## 2018-11-18 DIAGNOSIS — R748 Abnormal levels of other serum enzymes: Secondary | ICD-10-CM | POA: Diagnosis not present

## 2018-11-18 DIAGNOSIS — M0579 Rheumatoid arthritis with rheumatoid factor of multiple sites without organ or systems involvement: Secondary | ICD-10-CM | POA: Diagnosis not present

## 2018-11-18 DIAGNOSIS — Z79899 Other long term (current) drug therapy: Secondary | ICD-10-CM | POA: Diagnosis not present

## 2018-11-18 LAB — COMPREHENSIVE METABOLIC PANEL
ALT: 65 U/L — ABNORMAL HIGH (ref 0–44)
AST: 47 U/L — ABNORMAL HIGH (ref 15–41)
Albumin: 4.4 g/dL (ref 3.5–5.0)
Alkaline Phosphatase: 78 U/L (ref 38–126)
Anion gap: 10 (ref 5–15)
BUN: 19 mg/dL (ref 6–20)
CO2: 22 mmol/L (ref 22–32)
Calcium: 9.3 mg/dL (ref 8.9–10.3)
Chloride: 107 mmol/L (ref 98–111)
Creatinine, Ser: 0.9 mg/dL (ref 0.44–1.00)
GFR calc Af Amer: >60 mL/min (ref >60)
GFR calc non Af Amer: >60 mL/min (ref >60)
Glucose, Bld: 94 mg/dL (ref 70–99)
Potassium: 3.6 mmol/L (ref 3.5–5.1)
Sodium: 139 mmol/L (ref 135–145)
Total Bilirubin: 1.6 mg/dL — ABNORMAL HIGH (ref 0.3–1.2)
Total Protein: 8.1 g/dL (ref 6.5–8.1)

## 2018-11-18 LAB — CBC WITH DIFFERENTIAL/PLATELET
Abs Immature Granulocytes: 0.01 10*3/uL (ref 0.00–0.07)
Basophils Absolute: 0 10*3/uL (ref 0.0–0.1)
Basophils Relative: 0 %
Eosinophils Absolute: 0.1 10*3/uL (ref 0.0–0.5)
Eosinophils Relative: 1 %
HCT: 42.4 % (ref 36.0–46.0)
Hemoglobin: 15.3 g/dL — ABNORMAL HIGH (ref 12.0–15.0)
Immature Granulocytes: 0 %
Lymphocytes Relative: 40 %
Lymphs Abs: 2.7 10*3/uL (ref 0.7–4.0)
MCH: 31.8 pg (ref 26.0–34.0)
MCHC: 36.1 g/dL — ABNORMAL HIGH (ref 30.0–36.0)
MCV: 88.1 fL (ref 80.0–100.0)
Monocytes Absolute: 0.4 10*3/uL (ref 0.1–1.0)
Monocytes Relative: 6 %
Neutro Abs: 3.6 10*3/uL (ref 1.7–7.7)
Neutrophils Relative %: 53 %
Platelets: 139 10*3/uL — ABNORMAL LOW (ref 150–400)
RBC: 4.81 MIL/uL (ref 3.87–5.11)
RDW: 12.1 % (ref 11.5–15.5)
WBC: 6.8 10*3/uL (ref 4.0–10.5)
nRBC: 0 % (ref 0.0–0.2)

## 2018-11-19 LAB — MULTIPLE MYELOMA PANEL, SERUM
Albumin SerPl Elph-Mcnc: 4.2 g/dL (ref 2.9–4.4)
Albumin/Glob SerPl: 1.3 (ref 0.7–1.7)
Alpha 1: 0.1 g/dL (ref 0.0–0.4)
Alpha2 Glob SerPl Elph-Mcnc: 0.7 g/dL (ref 0.4–1.0)
B-Globulin SerPl Elph-Mcnc: 1.3 g/dL (ref 0.7–1.3)
Gamma Glob SerPl Elph-Mcnc: 1.3 g/dL (ref 0.4–1.8)
Globulin, Total: 3.4 g/dL (ref 2.2–3.9)
IgA: 720 mg/dL — ABNORMAL HIGH (ref 87–352)
IgG (Immunoglobin G), Serum: 1326 mg/dL (ref 586–1602)
IgM (Immunoglobulin M), Srm: 116 mg/dL (ref 26–217)
M Protein SerPl Elph-Mcnc: 0.4 g/dL — ABNORMAL HIGH
Total Protein ELP: 7.6 g/dL (ref 6.0–8.5)

## 2018-11-19 LAB — KAPPA/LAMBDA LIGHT CHAINS
Kappa free light chain: 22.7 mg/L — ABNORMAL HIGH (ref 3.3–19.4)
Kappa, lambda light chain ratio: 0.09 — ABNORMAL LOW (ref 0.26–1.65)
Lambda free light chains: 261.5 mg/L — ABNORMAL HIGH (ref 5.7–26.3)

## 2018-11-25 ENCOUNTER — Inpatient Hospital Stay (HOSPITAL_BASED_OUTPATIENT_CLINIC_OR_DEPARTMENT_OTHER): Payer: BC Managed Care – PPO | Admitting: Hematology and Oncology

## 2018-11-25 ENCOUNTER — Encounter: Payer: Self-pay | Admitting: Hematology and Oncology

## 2018-11-25 ENCOUNTER — Other Ambulatory Visit: Payer: Self-pay

## 2018-11-25 DIAGNOSIS — R748 Abnormal levels of other serum enzymes: Secondary | ICD-10-CM

## 2018-11-25 DIAGNOSIS — D472 Monoclonal gammopathy: Secondary | ICD-10-CM | POA: Diagnosis not present

## 2018-11-25 DIAGNOSIS — Z79899 Other long term (current) drug therapy: Secondary | ICD-10-CM

## 2018-11-25 DIAGNOSIS — M0579 Rheumatoid arthritis with rheumatoid factor of multiple sites without organ or systems involvement: Secondary | ICD-10-CM

## 2018-11-25 NOTE — Assessment & Plan Note (Signed)
So far, she has no signs of clinical progression I briefly spent some time educating the patient natural history of MGUS. I plan to see her back in 12 months for repeat history, physical examination and blood work. I reinforced the importance of annual influenza vaccination

## 2018-11-25 NOTE — Progress Notes (Signed)
[facility] OFFICE PROGRESS NOTE  Patient Care Team: Martyn Malay, MD as PCP - General (Family Medicine) Hennie Duos, MD as Consulting Physician (Rheumatology) Heath Lark, MD as Consulting Physician (Hematology and Oncology)  ASSESSMENT & PLAN:  MGUS (monoclonal gammopathy of unknown significance) So far, she has no signs of clinical progression I briefly spent some time educating the patient natural history of MGUS. I plan to see her back in 12 months for repeat history, physical examination and blood work. I reinforced the importance of annual influenza vaccination   Elevated liver enzymes This could be related to fatty liver disease She had imaging study done in 2015 which confirmed steatosis  Rheumatoid arthritis involving multiple sites with positive rheumatoid factor ([place]) I will defer management to her rheumatologist Due to long-term prednisone therapy, we discussed vitamin D and calcium supplementation to prevent risk of osteoporosis   Orders Placed This Encounter  Procedures  . Comprehensive metabolic panel    Standing Status:   Future    Standing Expiration Date:   12/30/2019  . CBC with Differential/Platelet    Standing Status:   Future    Standing Expiration Date:   12/30/2019  . Kappa/lambda light chains    Standing Status:   Future    Standing Expiration Date:   12/30/2019  . Multiple Myeloma Panel (SPEP&IFE w/QIG)    Standing Status:   Future    Standing Expiration Date:   12/30/2019    INTERVAL HISTORY: Please see below for problem oriented charting. She returns for further follow-up She is doing well Her chronic joint pain is stable with current medication as prescribed by her rheumatologist No recent infection  SUMMARY OF ONCOLOGIC HISTORY:  [name] is here because of abnormal blood work. She has rheumatoid arthritis and is undergoing chronic treatment. She was treated with methotrexate in the past, over 15 years ago.  She has been on intermittent doses of prednisone and prior treatment with Enbrel. Most recently, she was started on Orencia. Her rheumatologist order SPEP which came back abnormal and hence she is referred here. The patient was under the impression she is being referred here because of abnormalities in the liver. On further review, she was noted to have abnormal liver function tests. She had imaging study done in her abdomen many years ago which show evidence of splenomegaly. She is not aware of prior diagnosis of hepatitis. On further review of her blood work, she is also noted to have thrombocytopenia.The patient denies any recent signs or symptoms of bleeding such as spontaneous epistaxis, hematuria or hematochezia. She has never received transfusions before.  She denies history of abnormal bone pain or bone fracture. Patient denies history of recurrent infection or atypical infections such as shingles of meningitis. Denies chills, night sweats, anorexia or abnormal weight loss. She had history of seizure disorder but has not taken any medications recently. 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, on observation REVIEW OF SYSTEMS:   Constitutional: Denies fevers, chills or abnormal weight loss Eyes: Denies blurriness of vision Ears, nose, mouth, throat, and face: Denies mucositis or sore throat Respiratory: Denies cough, dyspnea or wheezes Cardiovascular: Denies palpitation, chest discomfort or lower extremity swelling Gastrointestinal:  Denies nausea, heartburn or change in bowel habits Skin: Denies abnormal skin rashes Lymphatics: Denies new lymphadenopathy or easy bruising Neurological:Denies numbness, tingling or new weaknesses Behavioral/Psych: Mood is stable, no new changes  All other  systems were reviewed with the patient and are negative.  I have reviewed the past medical history, past surgical history, social history and  family history with the patient and they are unchanged from previous note.  ALLERGIES:  is allergic to celebrex [celecoxib] and sulfur.  MEDICATIONS:  Current Outpatient Medications  Medication Sig Dispense Refill  . XELJANZ XR 11 MG TB24 Take 11 mg by mouth daily.    . predniSONE (DELTASONE) 5 MG tablet Take 5 mg by mouth daily as needed (flare up). Reported on 09/12/2015  0   No current facility-administered medications for this visit.     PHYSICAL EXAMINATION: ECOG PERFORMANCE STATUS: 0 - Asymptomatic  Vitals:   11/25/18 0839  BP: (!) 142/86  Pulse: 72  Resp: 18  Temp: 98.7 F (37.1 C)  SpO2: 100%   Filed Weights   11/25/18 0839  Weight: 155 lb (70.3 kg)    GENERAL:alert, no distress and comfortable Musculoskeletal:no cyanosis of digits and no clubbing  NEURO: alert & oriented x 3 with fluent speech, no focal motor/sensory deficits  LABORATORY DATA:  I have reviewed the data as listed    Component Value Date/Time   NA 139 11/18/2018 1104   NA 143 03/26/2018 1118   NA 143 08/18/2015 1156   K 3.6 11/18/2018 1104   K 3.5 08/18/2015 1156   CL 107 11/18/2018 1104   CO2 22 11/18/2018 1104   CO2 26 08/18/2015 1156   GLUCOSE 94 11/18/2018 1104   GLUCOSE 107 08/18/2015 1156   BUN 19 11/18/2018 1104   BUN 11 03/26/2018 1118   BUN 12.4 08/18/2015 1156   CREATININE 0.90 11/18/2018 1104   CREATININE 0.9 08/18/2015 1156   CALCIUM 9.3 11/18/2018 1104   CALCIUM 9.1 08/18/2015 1156   PROT 8.1 11/18/2018 1104   PROT 6.7 11/20/2016 1356   PROT 7.7 08/18/2015 1156   ALBUMIN 4.4 11/18/2018 1104   ALBUMIN 4.0 08/18/2015 1156   AST 47 (H) 11/18/2018 1104   AST 66 (H) 08/18/2015 1156   ALT 65 (H) 11/18/2018 1104   ALT 117 (H) 08/18/2015 1156   ALKPHOS 78 11/18/2018 1104   ALKPHOS 70 08/18/2015 1156   BILITOT 1.6 (H) 11/18/2018 1104   BILITOT 1.00 08/18/2015 1156   GFRNONAA >60 11/18/2018 1104   GFRAA >60 11/18/2018 1104    No results found for: SPEP, UPEP  Lab  Results  Component Value Date   WBC 6.8 11/18/2018   NEUTROABS 3.6 11/18/2018   HGB 15.3 (H) 11/18/2018   HCT 42.4 11/18/2018   MCV 88.1 11/18/2018   PLT 139 (L) 11/18/2018      Chemistry      Component Value Date/Time   NA 139 11/18/2018 1104   NA 143 03/26/2018 1118   NA 143 08/18/2015 1156   K 3.6 11/18/2018 1104   K 3.5 08/18/2015 1156   CL 107 11/18/2018 1104   CO2 22 11/18/2018 1104   CO2 26 08/18/2015 1156   BUN 19 11/18/2018 1104   BUN 11 03/26/2018 1118   BUN 12.4 08/18/2015 1156   CREATININE 0.90 11/18/2018 1104   CREATININE 0.9 08/18/2015 1156      Component Value Date/Time   CALCIUM 9.3 11/18/2018 1104   CALCIUM 9.1 08/18/2015 1156   ALKPHOS 78 11/18/2018 1104   ALKPHOS 70 08/18/2015 1156   AST 47 (H) 11/18/2018 1104   AST 66 (H) 08/18/2015 1156   ALT 65 (H) 11/18/2018 1104   ALT 117 (H) 08/18/2015 1156  BILITOT 1.6 (H) 11/18/2018 1104   BILITOT 1.00 08/18/2015 1156      All questions were answered. The patient knows to call the clinic with any problems, questions or concerns. No barriers to learning was detected.  I spent 10 minutes counseling the patient face to face. The total time spent in the appointment was 15 minutes and more than 50% was on counseling and review of test results  Heath Lark, MD 11/25/2018 9:56 AM

## 2018-11-25 NOTE — Assessment & Plan Note (Signed)
This could be related to fatty liver disease She had imaging study done in 2015 which confirmed steatosis

## 2018-11-25 NOTE — Assessment & Plan Note (Signed)
I will defer management to her rheumatologist Due to long-term prednisone therapy, we discussed vitamin D and calcium supplementation to prevent risk of osteoporosis

## 2018-11-26 ENCOUNTER — Telehealth: Payer: Self-pay | Admitting: Hematology and Oncology

## 2018-11-26 NOTE — Telephone Encounter (Signed)
I talk with patient regarding schedule  

## 2019-02-20 DIAGNOSIS — L608 Other nail disorders: Secondary | ICD-10-CM | POA: Diagnosis not present

## 2019-02-20 DIAGNOSIS — R569 Unspecified convulsions: Secondary | ICD-10-CM | POA: Diagnosis not present

## 2019-02-20 DIAGNOSIS — R7989 Other specified abnormal findings of blood chemistry: Secondary | ICD-10-CM | POA: Diagnosis not present

## 2019-02-20 DIAGNOSIS — Z6826 Body mass index (BMI) 26.0-26.9, adult: Secondary | ICD-10-CM | POA: Diagnosis not present

## 2019-02-20 DIAGNOSIS — H9042 Sensorineural hearing loss, unilateral, left ear, with unrestricted hearing on the contralateral side: Secondary | ICD-10-CM | POA: Diagnosis not present

## 2019-02-20 DIAGNOSIS — D472 Monoclonal gammopathy: Secondary | ICD-10-CM | POA: Diagnosis not present

## 2019-02-20 DIAGNOSIS — M0589 Other rheumatoid arthritis with rheumatoid factor of multiple sites: Secondary | ICD-10-CM | POA: Diagnosis not present

## 2019-02-20 DIAGNOSIS — M25432 Effusion, left wrist: Secondary | ICD-10-CM | POA: Diagnosis not present

## 2019-02-20 DIAGNOSIS — M25462 Effusion, left knee: Secondary | ICD-10-CM | POA: Diagnosis not present

## 2019-02-20 DIAGNOSIS — Z79899 Other long term (current) drug therapy: Secondary | ICD-10-CM | POA: Diagnosis not present

## 2019-04-07 ENCOUNTER — Ambulatory Visit (INDEPENDENT_AMBULATORY_CARE_PROVIDER_SITE_OTHER): Payer: Managed Care, Other (non HMO) | Admitting: Family Medicine

## 2019-04-07 ENCOUNTER — Other Ambulatory Visit: Payer: Self-pay

## 2019-04-07 VITALS — BP 117/80 | HR 80 | Temp 98.2°F | Wt 160.2 lb

## 2019-04-07 DIAGNOSIS — L03113 Cellulitis of right upper limb: Secondary | ICD-10-CM | POA: Insufficient documentation

## 2019-04-07 MED ORDER — CEPHALEXIN 500 MG PO CAPS: 500.0000 mg | ORAL_CAPSULE | Freq: Two times a day (BID) | ORAL | 0 refills | Status: AC

## 2019-04-07 NOTE — Patient Instructions (Signed)
It was nice meeting you today [name]!  I am sending an antibiotic called Keflex for a possible skin infection due to an insect bite.  Please take this medication twice a daily for 7 days.  Please let us know if you continue to have worsening redness and swelling or if you develop a fever or are overall not feeling well.  Please use a warm compress on the area but otherwise do not squeeze the area or pick at it so that it will heal and not become more infected.  If you have any questions or concerns, please feel free to call the clinic.   Be well,  Dr. Shan Levans

## 2019-04-07 NOTE — Progress Notes (Signed)
   Subjective:    [name] - 53 y.o. female MRN [id number]  Date of birth: 1965-12-29  CC:  [name] is here for an insect bite.  HPI: Started three days ago, looked like an insect bite at that time Not itchy Has progressed to a very tender, red, and swollen area on dorsum of R wrist Swelling has prevented her from fully flexing and extending wrist No fevers, chills, overall feeling well Has never occurred before Thinks it is a type of insect bite Currently on immunosuppression for RA  Health Maintenance:  Health Maintenance Due  Topic Date Due  . HIV Screening  10/11/1980  . TETANUS/TDAP  10/11/1984  . PAP SMEAR-Modifier  10/12/1986  . MAMMOGRAM  10/12/2015  . COLONOSCOPY  10/12/2015  . INFLUENZA VACCINE  12/13/2018    -  reports that she has never smoked. She has never used smokeless tobacco. - Review of Systems: Per HPI. - Past Medical History: Patient Active Problem List   Diagnosis Date Noted  . Cellulitis of right upper extremity 04/07/2019  . Epigastric pain 09/26/2018  . Elevated liver enzymes 08/18/2015  . MGUS (monoclonal gammopathy of unknown significance) 08/18/2015  . Splenomegaly 08/18/2015  . Vitamin B12 deficiency 08/18/2015  . 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 Seizure disorder ([place]) 11/25/2014  . Rheumatoid arthritis involving multiple sites with positive rheumatoid factor ([place]) 09/08/2013  . 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 Psych: good insight, alert and oriented          Assessment & Plan:   Cellulitis of right upper extremity It is possible that patient's  symptoms are all due to inflammation, but infection is certainly high on the differential due to her immunosuppression and the extent of her erythema and swelling.  No abscess noted on physical exam, and reassured that patient does not have systemic signs of infection.  Will prescribe Keflex 500 mg twice daily for 7 days for treatment.  Patient was given return precautions and instructions on caring for the affected area of her skin.    Maia Breslow, M.D. 04/07/2019, 4:48 PM PGY-3, [place] [facility]

## 2019-04-07 NOTE — Assessment & Plan Note (Signed)
It is possible that patient's symptoms are all due to inflammation, but infection is certainly high on the differential due to her immunosuppression and the extent of her erythema and swelling.  No abscess noted on physical exam, and reassured that patient does not have systemic signs of infection.  Will prescribe Keflex 500 mg twice daily for 7 days for treatment.  Patient was given return precautions and instructions on caring for the affected area of her skin.

## 2019-04-13 ENCOUNTER — Ambulatory Visit: Payer: BLUE CROSS/BLUE SHIELD | Admitting: Neurology

## 2019-05-26 DIAGNOSIS — M25471 Effusion, right ankle: Secondary | ICD-10-CM | POA: Diagnosis not present

## 2019-05-26 DIAGNOSIS — R569 Unspecified convulsions: Secondary | ICD-10-CM | POA: Diagnosis not present

## 2019-05-26 DIAGNOSIS — L608 Other nail disorders: Secondary | ICD-10-CM | POA: Diagnosis not present

## 2019-05-26 DIAGNOSIS — Z79899 Other long term (current) drug therapy: Secondary | ICD-10-CM | POA: Diagnosis not present

## 2019-05-26 DIAGNOSIS — M0589 Other rheumatoid arthritis with rheumatoid factor of multiple sites: Secondary | ICD-10-CM | POA: Diagnosis not present

## 2019-05-26 DIAGNOSIS — Z6827 Body mass index (BMI) 27.0-27.9, adult: Secondary | ICD-10-CM | POA: Diagnosis not present

## 2019-05-26 DIAGNOSIS — H9042 Sensorineural hearing loss, unilateral, left ear, with unrestricted hearing on the contralateral side: Secondary | ICD-10-CM | POA: Diagnosis not present

## 2019-05-26 DIAGNOSIS — R7989 Other specified abnormal findings of blood chemistry: Secondary | ICD-10-CM | POA: Diagnosis not present

## 2019-05-26 DIAGNOSIS — E663 Overweight: Secondary | ICD-10-CM | POA: Diagnosis not present

## 2019-05-26 DIAGNOSIS — M25432 Effusion, left wrist: Secondary | ICD-10-CM | POA: Diagnosis not present

## 2019-05-26 DIAGNOSIS — D472 Monoclonal gammopathy: Secondary | ICD-10-CM | POA: Diagnosis not present

## 2019-05-26 DIAGNOSIS — M25462 Effusion, left knee: Secondary | ICD-10-CM | POA: Diagnosis not present

## 2019-07-03 DIAGNOSIS — M25432 Effusion, left wrist: Secondary | ICD-10-CM | POA: Diagnosis not present

## 2019-07-03 DIAGNOSIS — D472 Monoclonal gammopathy: Secondary | ICD-10-CM | POA: Diagnosis not present

## 2019-07-03 DIAGNOSIS — H9042 Sensorineural hearing loss, unilateral, left ear, with unrestricted hearing on the contralateral side: Secondary | ICD-10-CM | POA: Diagnosis not present

## 2019-07-03 DIAGNOSIS — Z6827 Body mass index (BMI) 27.0-27.9, adult: Secondary | ICD-10-CM | POA: Diagnosis not present

## 2019-07-03 DIAGNOSIS — Z79899 Other long term (current) drug therapy: Secondary | ICD-10-CM | POA: Diagnosis not present

## 2019-07-03 DIAGNOSIS — M0589 Other rheumatoid arthritis with rheumatoid factor of multiple sites: Secondary | ICD-10-CM | POA: Diagnosis not present

## 2019-07-03 DIAGNOSIS — R7989 Other specified abnormal findings of blood chemistry: Secondary | ICD-10-CM | POA: Diagnosis not present

## 2019-07-03 DIAGNOSIS — R569 Unspecified convulsions: Secondary | ICD-10-CM | POA: Diagnosis not present

## 2019-07-03 DIAGNOSIS — M25471 Effusion, right ankle: Secondary | ICD-10-CM | POA: Diagnosis not present

## 2019-07-03 DIAGNOSIS — L608 Other nail disorders: Secondary | ICD-10-CM | POA: Diagnosis not present

## 2019-07-03 DIAGNOSIS — E663 Overweight: Secondary | ICD-10-CM | POA: Diagnosis not present

## 2019-10-01 DIAGNOSIS — Z79899 Other long term (current) drug therapy: Secondary | ICD-10-CM | POA: Diagnosis not present

## 2019-10-01 DIAGNOSIS — M25462 Effusion, left knee: Secondary | ICD-10-CM | POA: Diagnosis not present

## 2019-10-01 DIAGNOSIS — M0589 Other rheumatoid arthritis with rheumatoid factor of multiple sites: Secondary | ICD-10-CM | POA: Diagnosis not present

## 2019-10-01 DIAGNOSIS — E663 Overweight: Secondary | ICD-10-CM | POA: Diagnosis not present

## 2019-10-01 DIAGNOSIS — D472 Monoclonal gammopathy: Secondary | ICD-10-CM | POA: Diagnosis not present

## 2019-10-01 DIAGNOSIS — M25471 Effusion, right ankle: Secondary | ICD-10-CM | POA: Diagnosis not present

## 2019-10-01 DIAGNOSIS — M25432 Effusion, left wrist: Secondary | ICD-10-CM | POA: Diagnosis not present

## 2019-10-01 DIAGNOSIS — H9042 Sensorineural hearing loss, unilateral, left ear, with unrestricted hearing on the contralateral side: Secondary | ICD-10-CM | POA: Diagnosis not present

## 2019-10-01 DIAGNOSIS — L608 Other nail disorders: Secondary | ICD-10-CM | POA: Diagnosis not present

## 2019-10-01 DIAGNOSIS — Z6827 Body mass index (BMI) 27.0-27.9, adult: Secondary | ICD-10-CM | POA: Diagnosis not present

## 2019-10-01 DIAGNOSIS — R7989 Other specified abnormal findings of blood chemistry: Secondary | ICD-10-CM | POA: Diagnosis not present

## 2019-10-01 DIAGNOSIS — R569 Unspecified convulsions: Secondary | ICD-10-CM | POA: Diagnosis not present

## 2019-11-19 ENCOUNTER — Other Ambulatory Visit: Payer: Self-pay

## 2019-11-19 ENCOUNTER — Inpatient Hospital Stay: Payer: Managed Care, Other (non HMO) | Attending: Hematology and Oncology

## 2019-11-19 DIAGNOSIS — M069 Rheumatoid arthritis, unspecified: Secondary | ICD-10-CM | POA: Insufficient documentation

## 2019-11-19 DIAGNOSIS — Z79899 Other long term (current) drug therapy: Secondary | ICD-10-CM | POA: Diagnosis not present

## 2019-11-19 DIAGNOSIS — D472 Monoclonal gammopathy: Secondary | ICD-10-CM | POA: Insufficient documentation

## 2019-11-19 DIAGNOSIS — G40909 Epilepsy, unspecified, not intractable, without status epilepticus: Secondary | ICD-10-CM | POA: Insufficient documentation

## 2019-11-19 DIAGNOSIS — Z7952 Long term (current) use of systemic steroids: Secondary | ICD-10-CM | POA: Insufficient documentation

## 2019-11-19 LAB — CBC WITH DIFFERENTIAL/PLATELET
Abs Immature Granulocytes: 0.01 10*3/uL (ref 0.00–0.07)
Basophils Absolute: 0 10*3/uL (ref 0.0–0.1)
Basophils Relative: 0 %
Eosinophils Absolute: 0.1 10*3/uL (ref 0.0–0.5)
Eosinophils Relative: 1 %
HCT: 38.8 % (ref 36.0–46.0)
Hemoglobin: 14 g/dL (ref 12.0–15.0)
Immature Granulocytes: 0 %
Lymphocytes Relative: 48 %
Lymphs Abs: 2.4 10*3/uL (ref 0.7–4.0)
MCH: 32.5 pg (ref 26.0–34.0)
MCHC: 36.1 g/dL — ABNORMAL HIGH (ref 30.0–36.0)
MCV: 90 fL (ref 80.0–100.0)
Monocytes Absolute: 0.3 10*3/uL (ref 0.1–1.0)
Monocytes Relative: 6 %
Neutro Abs: 2.3 10*3/uL (ref 1.7–7.7)
Neutrophils Relative %: 45 %
Platelets: 150 10*3/uL (ref 150–400)
RBC: 4.31 MIL/uL (ref 3.87–5.11)
RDW: 11.8 % (ref 11.5–15.5)
WBC: 5.1 10*3/uL (ref 4.0–10.5)
nRBC: 0 % (ref 0.0–0.2)

## 2019-11-19 LAB — COMPREHENSIVE METABOLIC PANEL
ALT: 41 U/L (ref 0–44)
AST: 30 U/L (ref 15–41)
Albumin: 4.1 g/dL (ref 3.5–5.0)
Alkaline Phosphatase: 86 U/L (ref 38–126)
Anion gap: 10 (ref 5–15)
BUN: 19 mg/dL (ref 6–20)
CO2: 24 mmol/L (ref 22–32)
Calcium: 9.1 mg/dL (ref 8.9–10.3)
Chloride: 108 mmol/L (ref 98–111)
Creatinine, Ser: 0.83 mg/dL (ref 0.44–1.00)
GFR calc Af Amer: >60 mL/min (ref >60)
GFR calc non Af Amer: >60 mL/min (ref >60)
Glucose, Bld: 116 mg/dL — ABNORMAL HIGH (ref 70–99)
Potassium: 3.9 mmol/L (ref 3.5–5.1)
Sodium: 142 mmol/L (ref 135–145)
Total Bilirubin: 0.7 mg/dL (ref 0.3–1.2)
Total Protein: 7.7 g/dL (ref 6.5–8.1)

## 2019-11-20 LAB — KAPPA/LAMBDA LIGHT CHAINS
Kappa free light chain: 24.3 mg/L — ABNORMAL HIGH (ref 3.3–19.4)
Kappa, lambda light chain ratio: 0.09 — ABNORMAL LOW (ref 0.26–1.65)
Lambda free light chains: 256.9 mg/L — ABNORMAL HIGH (ref 5.7–26.3)

## 2019-11-23 LAB — MULTIPLE MYELOMA PANEL, SERUM
Albumin SerPl Elph-Mcnc: 4.1 g/dL (ref 2.9–4.4)
Albumin/Glob SerPl: 1.4 (ref 0.7–1.7)
Alpha 1: 0.1 g/dL (ref 0.0–0.4)
Alpha2 Glob SerPl Elph-Mcnc: 0.4 g/dL (ref 0.4–1.0)
B-Globulin SerPl Elph-Mcnc: 1.3 g/dL (ref 0.7–1.3)
Gamma Glob SerPl Elph-Mcnc: 1.2 g/dL (ref 0.4–1.8)
Globulin, Total: 3 g/dL (ref 2.2–3.9)
IgA: 639 mg/dL — ABNORMAL HIGH (ref 87–352)
IgG (Immunoglobin G), Serum: 1169 mg/dL (ref 586–1602)
IgM (Immunoglobulin M), Srm: 110 mg/dL (ref 26–217)
M Protein SerPl Elph-Mcnc: 0.4 g/dL — ABNORMAL HIGH
Total Protein ELP: 7.1 g/dL (ref 6.0–8.5)

## 2019-11-25 ENCOUNTER — Other Ambulatory Visit: Payer: Self-pay

## 2019-11-25 ENCOUNTER — Encounter: Payer: Self-pay | Admitting: Hematology and Oncology

## 2019-11-25 ENCOUNTER — Inpatient Hospital Stay (HOSPITAL_BASED_OUTPATIENT_CLINIC_OR_DEPARTMENT_OTHER): Payer: Managed Care, Other (non HMO) | Admitting: Hematology and Oncology

## 2019-11-25 VITALS — BP 136/81 | HR 78 | Temp 98.2°F | Resp 18 | Ht 64.5 in | Wt 157.2 lb

## 2019-11-25 DIAGNOSIS — Z79899 Other long term (current) drug therapy: Secondary | ICD-10-CM | POA: Diagnosis not present

## 2019-11-25 DIAGNOSIS — M069 Rheumatoid arthritis, unspecified: Secondary | ICD-10-CM | POA: Diagnosis not present

## 2019-11-25 DIAGNOSIS — D472 Monoclonal gammopathy: Secondary | ICD-10-CM

## 2019-11-25 DIAGNOSIS — Z7952 Long term (current) use of systemic steroids: Secondary | ICD-10-CM | POA: Diagnosis not present

## 2019-11-25 DIAGNOSIS — G40909 Epilepsy, unspecified, not intractable, without status epilepticus: Secondary | ICD-10-CM | POA: Diagnosis not present

## 2019-11-25 NOTE — Progress Notes (Signed)
[facility] OFFICE PROGRESS NOTE  Patient Care Team: Martyn Malay, MD as PCP - General (Family Medicine) Hennie Duos, MD as Consulting Physician (Rheumatology) Heath Lark, MD as Consulting Physician (Hematology and Oncology)  ASSESSMENT & PLAN:  MGUS (monoclonal gammopathy of unknown significance) So far, she has no signs of clinical progression I briefly spent some time educating the patient natural history of MGUS. I plan to see her back in 12 months for repeat history, physical examination and blood work. I reinforced the importance of annual influenza vaccination and encouraged her to consider Covid vaccination We also discussed the importance of calcium and vitamin D supplementation   Orders Placed This Encounter  Procedures  . CBC with Differential/Platelet    Standing Status:   Standing    Number of Occurrences:   22    Standing Expiration Date:   11/24/2020  . Comprehensive metabolic panel    Standing Status:   Standing    Number of Occurrences:   33    Standing Expiration Date:   11/24/2020  . Multiple Myeloma Panel (SPEP&IFE w/QIG)    Standing Status:   Standing    Number of Occurrences:   22    Standing Expiration Date:   11/24/2020  . Kappa/lambda light chains    Standing Status:   Standing    Number of Occurrences:   22    Standing Expiration Date:   11/24/2020    All questions were answered. The patient knows to call the clinic with any problems, questions or concerns. The total time spent in the appointment was 15 minutes encounter with patients including review of chart and various tests results, discussions about plan of care and coordination of care plan   Heath Lark, MD 11/25/2019 9:41 AM  INTERVAL HISTORY: Please see below for problem oriented charting. She returns for further follow-up She is doing well Her rheumatoid arthritis is under controlled She was prescribed naproxen for pain management but developed skin rash She has  appointment to meet with her physician to discuss other form of prescriptions for her arthritis pain Otherwise, she have no other new symptoms No recent infection, fever or chills  SUMMARY OF ONCOLOGIC HISTORY:  [name] is here because of abnormal blood work. She has rheumatoid arthritis and is undergoing chronic treatment. She was treated with methotrexate in the past, over 15 years ago. She has been on intermittent doses of prednisone and prior treatment with Enbrel. Most recently, she was started on Orencia. Her rheumatologist order SPEP which came back abnormal and hence she is referred here. The patient was under the impression she is being referred here because of abnormalities in the liver. On further review, she was noted to have abnormal liver function tests. She had imaging study done in her abdomen many years ago which show evidence of splenomegaly. She is not aware of prior diagnosis of hepatitis. On further review of her blood work, she is also noted to have thrombocytopenia.The patient denies any recent signs or symptoms of bleeding such as spontaneous epistaxis, hematuria or hematochezia. She has never received transfusions before.  She denies history of abnormal bone pain or bone fracture. Patient denies history of recurrent infection or atypical infections such as shingles of meningitis. Denies chills, night sweats, anorexia or abnormal weight loss. She had history of seizure disorder but has not taken any medications recently. 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,  on observation  REVIEW OF SYSTEMS:   Constitutional: Denies fevers, chills or abnormal weight loss Eyes: Denies blurriness of vision Ears, nose, mouth, throat, and face: Denies mucositis or sore throat Respiratory: Denies cough, dyspnea or wheezes Cardiovascular: Denies palpitation, chest discomfort or lower extremity swelling Gastrointestinal:   Denies nausea, heartburn or change in bowel habits Skin: Denies abnormal skin rashes Lymphatics: Denies new lymphadenopathy or easy bruising Neurological:Denies numbness, tingling or new weaknesses Behavioral/Psych: Mood is stable, no new changes  All other systems were reviewed with the patient and are negative.  I have reviewed the past medical history, past surgical history, social history and family history with the patient and they are unchanged from previous note.  ALLERGIES:  is allergic to celebrex [celecoxib] and sulfur.  MEDICATIONS:  Current Outpatient Medications  Medication Sig Dispense Refill  . predniSONE (DELTASONE) 5 MG tablet Take 5 mg by mouth daily as needed (flare up). Reported on 09/12/2015  0  . XELJANZ XR 11 MG TB24 Take 11 mg by mouth daily.     No current facility-administered medications for this visit.    PHYSICAL EXAMINATION: ECOG PERFORMANCE STATUS: 0 - Asymptomatic  Vitals:   11/25/19 0925  BP: 136/81  Pulse: 78  Resp: 18  Temp: 98.2 F (36.8 C)  SpO2: 100%   Filed Weights   11/25/19 0925  Weight: 157 lb 3.2 oz (71.3 kg)    GENERAL:alert, no distress and comfortable NEURO: alert & oriented x 3 with fluent speech, no focal motor/sensory deficits  LABORATORY DATA:  I have reviewed the data as listed    Component Value Date/Time   NA 142 11/19/2019 1055   NA 143 03/26/2018 1118   NA 143 08/18/2015 1156   K 3.9 11/19/2019 1055   K 3.5 08/18/2015 1156   CL 108 11/19/2019 1055   CO2 24 11/19/2019 1055   CO2 26 08/18/2015 1156   GLUCOSE 116 (H) 11/19/2019 1055   GLUCOSE 107 08/18/2015 1156   BUN 19 11/19/2019 1055   BUN 11 03/26/2018 1118   BUN 12.4 08/18/2015 1156   CREATININE 0.83 11/19/2019 1055   CREATININE 0.9 08/18/2015 1156   CALCIUM 9.1 11/19/2019 1055   CALCIUM 9.1 08/18/2015 1156   PROT 7.7 11/19/2019 1055   PROT 6.7 11/20/2016 1356   PROT 7.7 08/18/2015 1156   ALBUMIN 4.1 11/19/2019 1055   ALBUMIN 4.0 08/18/2015 1156    AST 30 11/19/2019 1055   AST 66 (H) 08/18/2015 1156   ALT 41 11/19/2019 1055   ALT 117 (H) 08/18/2015 1156   ALKPHOS 86 11/19/2019 1055   ALKPHOS 70 08/18/2015 1156   BILITOT 0.7 11/19/2019 1055   BILITOT 1.00 08/18/2015 1156   GFRNONAA >60 11/19/2019 1055   GFRAA >60 11/19/2019 1055    No results found for: SPEP, UPEP  Lab Results  Component Value Date   WBC 5.1 11/19/2019   NEUTROABS 2.3 11/19/2019   HGB 14.0 11/19/2019   HCT 38.8 11/19/2019   MCV 90.0 11/19/2019   PLT 150 11/19/2019      Chemistry      Component Value Date/Time   NA 142 11/19/2019 1055   NA 143 03/26/2018 1118   NA 143 08/18/2015 1156   K 3.9 11/19/2019 1055   K 3.5 08/18/2015 1156   CL 108 11/19/2019 1055   CO2 24 11/19/2019 1055   CO2 26 08/18/2015 1156   BUN 19 11/19/2019 1055   BUN 11 03/26/2018 1118   BUN 12.4 08/18/2015 1156  CREATININE 0.83 11/19/2019 1055   CREATININE 0.9 08/18/2015 1156      Component Value Date/Time   CALCIUM 9.1 11/19/2019 1055   CALCIUM 9.1 08/18/2015 1156   ALKPHOS 86 11/19/2019 1055   ALKPHOS 70 08/18/2015 1156   AST 30 11/19/2019 1055   AST 66 (H) 08/18/2015 1156   ALT 41 11/19/2019 1055   ALT 117 (H) 08/18/2015 1156   BILITOT 0.7 11/19/2019 1055   BILITOT 1.00 08/18/2015 1156

## 2019-11-25 NOTE — Assessment & Plan Note (Signed)
So far, she has no signs of clinical progression I briefly spent some time educating the patient natural history of MGUS. I plan to see her back in 12 months for repeat history, physical examination and blood work. I reinforced the importance of annual influenza vaccination and encouraged her to consider Covid vaccination We also discussed the importance of calcium and vitamin D supplementation

## 2019-11-26 ENCOUNTER — Ambulatory Visit: Payer: BC Managed Care – PPO | Admitting: Hematology and Oncology

## 2020-01-04 DIAGNOSIS — D472 Monoclonal gammopathy: Secondary | ICD-10-CM | POA: Diagnosis not present

## 2020-01-04 DIAGNOSIS — M0589 Other rheumatoid arthritis with rheumatoid factor of multiple sites: Secondary | ICD-10-CM | POA: Diagnosis not present

## 2020-01-04 DIAGNOSIS — M25432 Effusion, left wrist: Secondary | ICD-10-CM | POA: Diagnosis not present

## 2020-01-04 DIAGNOSIS — R7989 Other specified abnormal findings of blood chemistry: Secondary | ICD-10-CM | POA: Diagnosis not present

## 2020-01-04 DIAGNOSIS — Z6827 Body mass index (BMI) 27.0-27.9, adult: Secondary | ICD-10-CM | POA: Diagnosis not present

## 2020-01-04 DIAGNOSIS — H9042 Sensorineural hearing loss, unilateral, left ear, with unrestricted hearing on the contralateral side: Secondary | ICD-10-CM | POA: Diagnosis not present

## 2020-01-04 DIAGNOSIS — R569 Unspecified convulsions: Secondary | ICD-10-CM | POA: Diagnosis not present

## 2020-01-04 DIAGNOSIS — L608 Other nail disorders: Secondary | ICD-10-CM | POA: Diagnosis not present

## 2020-01-04 DIAGNOSIS — M25462 Effusion, left knee: Secondary | ICD-10-CM | POA: Diagnosis not present

## 2020-01-04 DIAGNOSIS — E663 Overweight: Secondary | ICD-10-CM | POA: Diagnosis not present

## 2020-01-04 DIAGNOSIS — M25471 Effusion, right ankle: Secondary | ICD-10-CM | POA: Diagnosis not present

## 2020-01-04 DIAGNOSIS — Z79899 Other long term (current) drug therapy: Secondary | ICD-10-CM | POA: Diagnosis not present

## 2020-01-27 ENCOUNTER — Ambulatory Visit (INDEPENDENT_AMBULATORY_CARE_PROVIDER_SITE_OTHER): Payer: Managed Care, Other (non HMO) | Admitting: Family Medicine

## 2020-01-27 ENCOUNTER — Other Ambulatory Visit: Payer: Self-pay

## 2020-01-27 DIAGNOSIS — L0212 Furuncle of neck: Secondary | ICD-10-CM

## 2020-01-27 DIAGNOSIS — L02521 Furuncle right hand: Secondary | ICD-10-CM | POA: Insufficient documentation

## 2020-01-27 MED ORDER — BACTROBAN NASAL 2 % NA OINT: 1.0000 "application " | TOPICAL_OINTMENT | Freq: Two times a day (BID) | NASAL | 0 refills | Status: DC

## 2020-01-27 MED ORDER — DOXYCYCLINE HYCLATE 100 MG PO TABS
100.0000 mg | ORAL_TABLET | Freq: Two times a day (BID) | ORAL | 0 refills | Status: AC
Start: 2020-01-27 — End: 2020-02-03

## 2020-01-27 NOTE — Patient Instructions (Signed)
[name], great to see you today! Your neck lump looks like an infection.  You have had multiple lumps like this over the last few years.  It could be due to an bacterial infection called MRSA.  I am prescribing you a week's course of doxycycline.  Please take this twice a day.  I am also prescribing you a nasal spray which treats MRSA in your nose where the bacteria colonizes.  Please follow-up on Friday so we can review the infection.  If you develop fevers, chills then please go to the ER immediately as you may need IV antibiotics.  Best wishes,  Dr. Posey Pronto

## 2020-01-27 NOTE — Assessment & Plan Note (Addendum)
Neck furuncle on exam today with no obvious drainage. 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 Stable vital signs and pt denies fevers or chills. She is at increased risk of infections due to immunosuppressive therapy for RA. Explained this to patient. Discussed treatment options of I&D VS antibiotics course with follow up. Pt would prefer to start antibiotics and come back to the clinic for follow up. Prescribed 7 day course of Doxycycline. Also prescribed Bactroban nasal spray to prevent nasal MRSA colonization. Follow up arranged for Friday 17th September in [facility]. Safety precautions provided to patient if she develops fevers or chills she should go to the ER.

## 2020-01-27 NOTE — Progress Notes (Signed)
    SUBJECTIVE:   CHIEF COMPLAINT / HPI:  [name] is a 54 year old female presents today with a neck lump with concerns for infection    Neck lump Patient noticed a neck lump which appeared 5 days ago.  It has been increasing in size and is tender to touch.  Denies drainage or bleeding. She is was worried that it was caused by a side effect of her medication for rheumatoid arthritis so she stopped this several days ago.  Takes an immunosuppressive medicaition  for RA and also short courses of steroids throughout the year. She is due for an appointment with the rheumatologist today. Denies fevers or chills  PERTINENT  PMH / PSH: Rheumatoid arthritis  OBJECTIVE:   BP 136/72   Pulse 80   Wt 161 lb (73 kg)   SpO2 98%   BMI 27.21 kg/m     4cm furuncle over right lateral neck with surrounding erythema. Tender on palpation. No obvious drainage.   ASSESSMENT/PLAN:   Furuncle of neck Neck furuncle on exam today with no obvious drainage. 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 Stable vital signs and pt denies fevers or chills. She is at increased risk of infections due to immunosuppressive therapy for RA. Explained this to patient. Discussed treatment options of I&D VS antibiotics course with follow up. Pt would prefer to start antibiotics and come back to the clinic for follow up. Prescribed 7 day course of Doxycycline. Also prescribed Bactroban nasal spray to prevent nasal MRSA colonization. Follow up arranged for Friday 17th September in [facility].     Lattie Haw, MD [place]

## 2020-01-29 ENCOUNTER — Ambulatory Visit: Payer: Medicare Other

## 2020-02-02 ENCOUNTER — Ambulatory Visit: Payer: Medicare Other | Admitting: Family Medicine

## 2020-03-16 ENCOUNTER — Ambulatory Visit (INDEPENDENT_AMBULATORY_CARE_PROVIDER_SITE_OTHER): Payer: Managed Care, Other (non HMO) | Admitting: Family Medicine

## 2020-03-16 ENCOUNTER — Encounter: Payer: Self-pay | Admitting: Family Medicine

## 2020-03-16 ENCOUNTER — Other Ambulatory Visit: Payer: Self-pay

## 2020-03-16 ENCOUNTER — Other Ambulatory Visit: Payer: Self-pay | Admitting: Family Medicine

## 2020-03-16 VITALS — BP 122/78 | HR 71 | Ht 64.0 in | Wt 161.2 lb

## 2020-03-16 DIAGNOSIS — Z114 Encounter for screening for human immunodeficiency virus [HIV]: Secondary | ICD-10-CM

## 2020-03-16 DIAGNOSIS — L02521 Furuncle right hand: Secondary | ICD-10-CM | POA: Diagnosis not present

## 2020-03-16 DIAGNOSIS — L02522 Furuncle left hand: Secondary | ICD-10-CM

## 2020-03-16 MED ORDER — DOXYCYCLINE HYCLATE 100 MG PO TABS: 100.0000 mg | ORAL_TABLET | Freq: Two times a day (BID) | ORAL | 0 refills | Status: DC

## 2020-03-16 MED ORDER — HIBICLENS 4 % EX LIQD: Freq: Every day | CUTANEOUS | 0 refills | Status: DC | PRN

## 2020-03-16 NOTE — Progress Notes (Signed)
    SUBJECTIVE:   CHIEF COMPLAINT / HPI:   Left hand boil [name] presents to clinic today for a progressive boil in her left hand for the past week.  She first noticed a bump about 1 week ago and is seem to be growing and redness and had a white center which she scratched.  She ended up hitting her hand a day or 2 ago and the bump spontaneously drained a white discharge.  That seemed to relieve some of the discomfort although she continues to have some redness and swelling of her hand.  She is able to move her hand, wrist and fingers well without difficulty.  She otherwise feels well and is not having any trouble with fever, chills, nausea.  She was seen for a similar issue on her neck about 1 month ago at which time she was prescribed 7-day course of antibiotics which helped significantly.  She would like to know if there is anything she can do to help prevent future boils from forming.  This seems to be recurrent issue every several months.  PERTINENT  PMH / PSH: Rheumatoid arthritis, occasional steroid use (not currently using steroids)  OBJECTIVE:   BP 122/78   Pulse 71   Ht 5\' 4"  (1.626 m)   Wt 161 lb 4 oz (73.1 kg)   SpO2 99%   BMI 27.68 kg/m    General: Resting comfortably sitting in her chair in the exam room.  No acute distress. Respiratory: Breathing comfortably on room air.  No respiratory difficulty. Cardiac: Regular rate and rhythm of peripheral pulses. Left hand: A 1 cm erythematous nodule was present on the dorsal surface of her fifth metacarpal.  Erythema spreading for about a 1 cm margin and mild tenderness to palpation.  No significant induration.  Evidence of crusted healing and recent spontaneous drainage.  Able to flex and extend all digits and wrist without issue.  ASSESSMENT/PLAN:   Furuncle of right hand It is not clear why she experiences these recurrent infections.  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  High suspicion for MRSA based on purulent drainage.   We will treat with antibiotics and attempted decolonization. -Doxycycline 100 twice daily for 7 days -Hibiclens oral mouth rinse 5 days in a row twice monthly for 6 months -Follow-up HIV and CBC -Follow-up in clinic for worsening symptoms     Brandi Haymaker, MD [place]

## 2020-03-16 NOTE — Assessment & Plan Note (Signed)
It is not clear why she experiences these recurrent infections.  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  High suspicion for MRSA based on purulent drainage.  We will treat with antibiotics and attempted decolonization. -Doxycycline 100 twice daily for 7 days -Hibiclens oral mouth rinse 5 days in a row twice monthly for 6 months -Follow-up HIV and CBC -Follow-up in clinic for worsening symptoms

## 2020-03-16 NOTE — Patient Instructions (Addendum)
Left hand boil:This seems to be an ongoing issue.  I have prescribed another 7-day course of doxycycline to help with the infection.  Please do return to clinic if you think that it is worsening.  We will also get blood work to see if there is anything that is making this a recurring issue.  To prevent further issues, he can try the mouthwash that we discussed.  The instructions are as follows: decolonization performed 5 days twice monthly for 6 months included chlorhexidine 0.12% mouthwash twice daily, daily baths or showers with rinse-off chlorhexidine 4%, and nasal mupirocin 2% twice daily.

## 2020-03-17 LAB — CBC
Hematocrit: 42.3 % (ref 34.0–46.6)
Hemoglobin: 14.5 g/dL (ref 11.1–15.9)
MCH: 31.3 pg (ref 26.6–33.0)
MCHC: 34.3 g/dL (ref 31.5–35.7)
MCV: 91 fL (ref 79–97)
Platelets: 147 10*3/uL — ABNORMAL LOW (ref 150–450)
RBC: 4.64 x10E6/uL (ref 3.77–5.28)
RDW: 12.3 % (ref 11.7–15.4)
WBC: 7.1 10*3/uL (ref 3.4–10.8)

## 2020-03-17 LAB — HIV ANTIBODY (ROUTINE TESTING W REFLEX): HIV Screen 4th Generation wRfx: NONREACTIVE

## 2020-04-05 DIAGNOSIS — Z6827 Body mass index (BMI) 27.0-27.9, adult: Secondary | ICD-10-CM | POA: Diagnosis not present

## 2020-04-05 DIAGNOSIS — H9042 Sensorineural hearing loss, unilateral, left ear, with unrestricted hearing on the contralateral side: Secondary | ICD-10-CM | POA: Diagnosis not present

## 2020-04-05 DIAGNOSIS — M0589 Other rheumatoid arthritis with rheumatoid factor of multiple sites: Secondary | ICD-10-CM | POA: Diagnosis not present

## 2020-04-05 DIAGNOSIS — E663 Overweight: Secondary | ICD-10-CM | POA: Diagnosis not present

## 2020-04-05 DIAGNOSIS — Z79899 Other long term (current) drug therapy: Secondary | ICD-10-CM | POA: Diagnosis not present

## 2020-04-05 DIAGNOSIS — R569 Unspecified convulsions: Secondary | ICD-10-CM | POA: Diagnosis not present

## 2020-04-05 DIAGNOSIS — L608 Other nail disorders: Secondary | ICD-10-CM | POA: Diagnosis not present

## 2020-04-05 DIAGNOSIS — D472 Monoclonal gammopathy: Secondary | ICD-10-CM | POA: Diagnosis not present

## 2020-04-05 DIAGNOSIS — R7989 Other specified abnormal findings of blood chemistry: Secondary | ICD-10-CM | POA: Diagnosis not present

## 2020-04-05 DIAGNOSIS — L0293 Carbuncle, unspecified: Secondary | ICD-10-CM | POA: Diagnosis not present

## 2020-06-01 ENCOUNTER — Ambulatory Visit (INDEPENDENT_AMBULATORY_CARE_PROVIDER_SITE_OTHER): Payer: Managed Care, Other (non HMO) | Admitting: Family Medicine

## 2020-06-01 ENCOUNTER — Encounter: Payer: Self-pay | Admitting: Family Medicine

## 2020-06-01 ENCOUNTER — Other Ambulatory Visit: Payer: Self-pay

## 2020-06-01 VITALS — BP 130/80 | HR 80 | Wt 156.8 lb

## 2020-06-01 DIAGNOSIS — R350 Frequency of micturition: Secondary | ICD-10-CM

## 2020-06-01 DIAGNOSIS — K529 Noninfective gastroenteritis and colitis, unspecified: Secondary | ICD-10-CM | POA: Insufficient documentation

## 2020-06-01 LAB — POCT URINALYSIS DIP (MANUAL ENTRY)
Glucose, UA: NEGATIVE mg/dL
Leukocytes, UA: NEGATIVE
Nitrite, UA: NEGATIVE
Protein Ur, POC: NEGATIVE mg/dL
Spec Grav, UA: >=1.03 — AB (ref 1.010–1.025)
Urobilinogen, UA: 1 E.U./dL
pH, UA: 5.5 (ref 5.0–8.0)

## 2020-06-01 NOTE — Patient Instructions (Addendum)
Gastroenteritis: Patient with heartburn today, I think you likely have a viral infection of your stomach will get better with time.  Sadly, there is no easy medication that I can give to help with abdominal discomfort or diarrhea.  You can cautiously use Imodium at home although be wary that can cause constipation.  We will get blood work and take a look at your urine today.  As long as everything is normal, there is no need for any further work-up.  Looks like there are quite a few things that we can do for age-appropriate cancer screening.  Please come back in the next month or 2 so we can make sure that we are giving you all of the best care that we can.

## 2020-06-01 NOTE — Progress Notes (Signed)
° ° °  SUBJECTIVE:   CHIEF COMPLAINT / HPI:   Gastroenteritis She has been experiencing abdominal discomfort and loose stools for the past 2 days.  The seem to be her only symptoms.  Her abdominal pain is an achy abdominal pain around the center of her stomach without any specific localization.  She notes that she generally feels lower energy but nothing else concerning at this time.  She specifically denies fever, congestion, sore throat, shortness of breath, chest pain, nausea, skin changes, muscle aches or pains.  She has not started any medication within the past month.  She notes that she had been drinking a lot of soda lately but tried to cut back about 1 week ago.  On review of systems, she did note some urinary frequency.  PERTINENT  PMH / PSH: Hepatic steatosis  OBJECTIVE:   BP 130/80    Pulse 80    Wt 156 lb 12.8 oz (71.1 kg)    SpO2 97%    BMI 26.91 kg/m    General: Alert and cooperative and appears to be in no acute distress  HEENT: Mildly dry mucous membranes. Cardio: Normal S1 and S2, no S3 or S4. Rhythm is regular. No murmurs or rubs.   Pulm: Clear to auscultation bilaterally, no crackles, wheezing, or diminished breath sounds. Normal respiratory effort Abdomen: Bowel sounds normal. Abdomen soft and moderately tender to palpation.  Her tenderness is primarily just above her umbilicus in the epigastric area although she does have some tenderness in the right upper and right lower quadrant. Extremities: No peripheral edema. Warm/ well perfused.  Strong radial pulses.  Mild skin tenting. Neuro: Cranial nerves grossly intact  ASSESSMENT/PLAN:   Gastroenteritis Based on her history and physical exam, I think this is most likely gastroenteritis.  She is not peritoneal.  I have a low suspicion for acute abdomen.  Based on some mild right upper quadrant pain I will move forward with a CMP which I think will also be helpful based on her history of hepatic steatosis.  As long as her CMP  is unremarkable and her CBC does not show any evidence of significant infection, I do not think any further intervention will be needed.  UA obtained due to some urinary frequency and abdominal pain but no evidence of infection. -Follow-up CMP, CBC   Healthcare maintenance She was informed that there are quite a few things we can go over to make sure she is up-to-date with all her healthcare maintenance.  She was encouraged to return to clinic in the next month once she feels better for [place] to catch up.  Matilde Haymaker, MD [place]

## 2020-06-01 NOTE — Assessment & Plan Note (Signed)
Based on her history and physical exam, I think this is most likely gastroenteritis.  She is not peritoneal.  I have a low suspicion for acute abdomen.  Based on some mild right upper quadrant pain I will move forward with a CMP which I think will also be helpful based on her history of hepatic steatosis.  As long as her CMP is unremarkable and her CBC does not show any evidence of significant infection, I do not think any further intervention will be needed.  UA obtained due to some urinary frequency and abdominal pain but no evidence of infection. -Follow-up CMP, CBC

## 2020-06-02 LAB — COMPREHENSIVE METABOLIC PANEL
ALT: 53 IU/L — ABNORMAL HIGH (ref 0–32)
AST: 39 IU/L (ref 0–40)
Albumin/Globulin Ratio: 1.6 (ref 1.2–2.2)
Albumin: 5.1 g/dL — ABNORMAL HIGH (ref 3.8–4.9)
Alkaline Phosphatase: 99 IU/L (ref 44–121)
BUN/Creatinine Ratio: 15 (ref 9–23)
BUN: 13 mg/dL (ref 6–24)
Bilirubin Total: 1.8 mg/dL — ABNORMAL HIGH (ref 0.0–1.2)
CO2: 20 mmol/L (ref 20–29)
Calcium: 9.6 mg/dL (ref 8.7–10.2)
Chloride: 102 mmol/L (ref 96–106)
Creatinine, Ser: 0.84 mg/dL (ref 0.57–1.00)
GFR calc Af Amer: 91 mL/min/{1.73_m2} (ref >59)
GFR calc non Af Amer: 79 mL/min/{1.73_m2} (ref >59)
Globulin, Total: 3.1 g/dL (ref 1.5–4.5)
Glucose: 107 mg/dL — ABNORMAL HIGH (ref 65–99)
Potassium: 3.9 mmol/L (ref 3.5–5.2)
Sodium: 137 mmol/L (ref 134–144)
Total Protein: 8.2 g/dL (ref 6.0–8.5)

## 2020-06-02 LAB — CBC
Hematocrit: 45.6 % (ref 34.0–46.6)
Hemoglobin: 16 g/dL — ABNORMAL HIGH (ref 11.1–15.9)
MCH: 32.5 pg (ref 26.6–33.0)
MCHC: 35.1 g/dL (ref 31.5–35.7)
MCV: 93 fL (ref 79–97)
Platelets: 174 10*3/uL (ref 150–450)
RBC: 4.93 x10E6/uL (ref 3.77–5.28)
RDW: 12 % (ref 11.7–15.4)
WBC: 7.6 10*3/uL (ref 3.4–10.8)

## 2020-06-03 LAB — URINE CULTURE

## 2020-07-08 DIAGNOSIS — Z6826 Body mass index (BMI) 26.0-26.9, adult: Secondary | ICD-10-CM | POA: Diagnosis not present

## 2020-07-08 DIAGNOSIS — R569 Unspecified convulsions: Secondary | ICD-10-CM | POA: Diagnosis not present

## 2020-07-08 DIAGNOSIS — D472 Monoclonal gammopathy: Secondary | ICD-10-CM | POA: Diagnosis not present

## 2020-07-08 DIAGNOSIS — Z79899 Other long term (current) drug therapy: Secondary | ICD-10-CM | POA: Diagnosis not present

## 2020-07-08 DIAGNOSIS — L608 Other nail disorders: Secondary | ICD-10-CM | POA: Diagnosis not present

## 2020-07-08 DIAGNOSIS — M25471 Effusion, right ankle: Secondary | ICD-10-CM | POA: Diagnosis not present

## 2020-07-08 DIAGNOSIS — H9042 Sensorineural hearing loss, unilateral, left ear, with unrestricted hearing on the contralateral side: Secondary | ICD-10-CM | POA: Diagnosis not present

## 2020-07-08 DIAGNOSIS — M0589 Other rheumatoid arthritis with rheumatoid factor of multiple sites: Secondary | ICD-10-CM | POA: Diagnosis not present

## 2020-07-08 DIAGNOSIS — L0293 Carbuncle, unspecified: Secondary | ICD-10-CM | POA: Diagnosis not present

## 2020-07-08 DIAGNOSIS — E663 Overweight: Secondary | ICD-10-CM | POA: Diagnosis not present

## 2020-07-08 DIAGNOSIS — R7989 Other specified abnormal findings of blood chemistry: Secondary | ICD-10-CM | POA: Diagnosis not present

## 2020-10-31 DIAGNOSIS — L608 Other nail disorders: Secondary | ICD-10-CM | POA: Diagnosis not present

## 2020-10-31 DIAGNOSIS — Z13220 Encounter for screening for lipoid disorders: Secondary | ICD-10-CM | POA: Diagnosis not present

## 2020-10-31 DIAGNOSIS — R7989 Other specified abnormal findings of blood chemistry: Secondary | ICD-10-CM | POA: Diagnosis not present

## 2020-10-31 DIAGNOSIS — D472 Monoclonal gammopathy: Secondary | ICD-10-CM | POA: Diagnosis not present

## 2020-10-31 DIAGNOSIS — H9042 Sensorineural hearing loss, unilateral, left ear, with unrestricted hearing on the contralateral side: Secondary | ICD-10-CM | POA: Diagnosis not present

## 2020-10-31 DIAGNOSIS — M0589 Other rheumatoid arthritis with rheumatoid factor of multiple sites: Secondary | ICD-10-CM | POA: Diagnosis not present

## 2020-10-31 DIAGNOSIS — R569 Unspecified convulsions: Secondary | ICD-10-CM | POA: Diagnosis not present

## 2020-10-31 DIAGNOSIS — Z6825 Body mass index (BMI) 25.0-25.9, adult: Secondary | ICD-10-CM | POA: Diagnosis not present

## 2020-10-31 DIAGNOSIS — Z79899 Other long term (current) drug therapy: Secondary | ICD-10-CM | POA: Diagnosis not present

## 2020-10-31 DIAGNOSIS — E663 Overweight: Secondary | ICD-10-CM | POA: Diagnosis not present

## 2020-10-31 DIAGNOSIS — L0293 Carbuncle, unspecified: Secondary | ICD-10-CM | POA: Diagnosis not present

## 2020-11-17 ENCOUNTER — Other Ambulatory Visit: Payer: Self-pay

## 2020-11-17 ENCOUNTER — Inpatient Hospital Stay: Payer: Managed Care, Other (non HMO) | Attending: Hematology and Oncology

## 2020-11-17 DIAGNOSIS — R161 Splenomegaly, not elsewhere classified: Secondary | ICD-10-CM | POA: Insufficient documentation

## 2020-11-17 DIAGNOSIS — M069 Rheumatoid arthritis, unspecified: Secondary | ICD-10-CM | POA: Diagnosis not present

## 2020-11-17 DIAGNOSIS — R7989 Other specified abnormal findings of blood chemistry: Secondary | ICD-10-CM | POA: Insufficient documentation

## 2020-11-17 DIAGNOSIS — D696 Thrombocytopenia, unspecified: Secondary | ICD-10-CM | POA: Insufficient documentation

## 2020-11-17 DIAGNOSIS — D472 Monoclonal gammopathy: Secondary | ICD-10-CM | POA: Insufficient documentation

## 2020-11-17 DIAGNOSIS — Z79899 Other long term (current) drug therapy: Secondary | ICD-10-CM | POA: Diagnosis not present

## 2020-11-17 LAB — CBC WITH DIFFERENTIAL/PLATELET
Abs Immature Granulocytes: 0.01 10*3/uL (ref 0.00–0.07)
Basophils Absolute: 0 10*3/uL (ref 0.0–0.1)
Basophils Relative: 1 %
Eosinophils Absolute: 0.1 10*3/uL (ref 0.0–0.5)
Eosinophils Relative: 1 %
HCT: 39.2 % (ref 36.0–46.0)
Hemoglobin: 14.5 g/dL (ref 12.0–15.0)
Immature Granulocytes: 0 %
Lymphocytes Relative: 36 %
Lymphs Abs: 1.8 10*3/uL (ref 0.7–4.0)
MCH: 32.5 pg (ref 26.0–34.0)
MCHC: 37 g/dL — ABNORMAL HIGH (ref 30.0–36.0)
MCV: 87.9 fL (ref 80.0–100.0)
Monocytes Absolute: 0.4 10*3/uL (ref 0.1–1.0)
Monocytes Relative: 7 %
Neutro Abs: 2.7 10*3/uL (ref 1.7–7.7)
Neutrophils Relative %: 55 %
Platelets: 140 10*3/uL — ABNORMAL LOW (ref 150–400)
RBC: 4.46 MIL/uL (ref 3.87–5.11)
RDW: 11.9 % (ref 11.5–15.5)
WBC: 4.9 10*3/uL (ref 4.0–10.5)
nRBC: 0 % (ref 0.0–0.2)

## 2020-11-17 LAB — COMPREHENSIVE METABOLIC PANEL
ALT: 38 U/L (ref 0–44)
AST: 26 U/L (ref 15–41)
Albumin: 4.1 g/dL (ref 3.5–5.0)
Alkaline Phosphatase: 72 U/L (ref 38–126)
Anion gap: 11 (ref 5–15)
BUN: 12 mg/dL (ref 6–20)
CO2: 23 mmol/L (ref 22–32)
Calcium: 9 mg/dL (ref 8.9–10.3)
Chloride: 108 mmol/L (ref 98–111)
Creatinine, Ser: 0.76 mg/dL (ref 0.44–1.00)
GFR, Estimated: >60 mL/min (ref >60)
Glucose, Bld: 99 mg/dL (ref 70–99)
Potassium: 3.9 mmol/L (ref 3.5–5.1)
Sodium: 142 mmol/L (ref 135–145)
Total Bilirubin: 1.1 mg/dL (ref 0.3–1.2)
Total Protein: 7.6 g/dL (ref 6.5–8.1)

## 2020-11-20 LAB — KAPPA/LAMBDA LIGHT CHAINS
Kappa free light chain: 23.8 mg/L — ABNORMAL HIGH (ref 3.3–19.4)
Kappa, lambda light chain ratio: 0.07 — ABNORMAL LOW (ref 0.26–1.65)
Lambda free light chains: 323.2 mg/L — ABNORMAL HIGH (ref 5.7–26.3)

## 2020-11-21 LAB — MULTIPLE MYELOMA PANEL, SERUM
Albumin SerPl Elph-Mcnc: 4 g/dL (ref 2.9–4.4)
Albumin/Glob SerPl: 1.3 (ref 0.7–1.7)
Alpha 1: 0.1 g/dL (ref 0.0–0.4)
Alpha2 Glob SerPl Elph-Mcnc: 0.5 g/dL (ref 0.4–1.0)
B-Globulin SerPl Elph-Mcnc: 1.4 g/dL — ABNORMAL HIGH (ref 0.7–1.3)
Gamma Glob SerPl Elph-Mcnc: 1.1 g/dL (ref 0.4–1.8)
Globulin, Total: 3.1 g/dL (ref 2.2–3.9)
IgA: 669 mg/dL — ABNORMAL HIGH (ref 87–352)
IgG (Immunoglobin G), Serum: 1124 mg/dL (ref 586–1602)
IgM (Immunoglobulin M), Srm: 102 mg/dL (ref 26–217)
M Protein SerPl Elph-Mcnc: 0.4 g/dL — ABNORMAL HIGH
Total Protein ELP: 7.1 g/dL (ref 6.0–8.5)

## 2020-11-24 ENCOUNTER — Inpatient Hospital Stay (HOSPITAL_BASED_OUTPATIENT_CLINIC_OR_DEPARTMENT_OTHER): Payer: Managed Care, Other (non HMO) | Admitting: Hematology and Oncology

## 2020-11-24 ENCOUNTER — Encounter: Payer: Self-pay | Admitting: Hematology and Oncology

## 2020-11-24 ENCOUNTER — Other Ambulatory Visit: Payer: Self-pay

## 2020-11-24 DIAGNOSIS — R161 Splenomegaly, not elsewhere classified: Secondary | ICD-10-CM | POA: Diagnosis not present

## 2020-11-24 DIAGNOSIS — R7989 Other specified abnormal findings of blood chemistry: Secondary | ICD-10-CM | POA: Diagnosis not present

## 2020-11-24 DIAGNOSIS — D472 Monoclonal gammopathy: Secondary | ICD-10-CM

## 2020-11-24 DIAGNOSIS — Z79899 Other long term (current) drug therapy: Secondary | ICD-10-CM | POA: Diagnosis not present

## 2020-11-24 DIAGNOSIS — D696 Thrombocytopenia, unspecified: Secondary | ICD-10-CM

## 2020-11-24 DIAGNOSIS — M069 Rheumatoid arthritis, unspecified: Secondary | ICD-10-CM | POA: Diagnosis not present

## 2020-11-24 NOTE — Progress Notes (Signed)
[facility] OFFICE PROGRESS NOTE  Patient Care Team: Martyn Malay, MD as PCP - General (Family Medicine) Hennie Duos, MD as Consulting Physician (Rheumatology) Heath Lark, MD as Consulting Physician (Hematology and Oncology)  ASSESSMENT & PLAN:  MGUS (monoclonal gammopathy of unknown significance) So far, she has no signs of clinical progression I briefly spent some time educating the patient natural history of MGUS. I plan to see her back in 12 months for repeat history and blood work. We also discussed the importance of calcium and vitamin D supplementation  Thrombocytopenia, unspecified (HCC) The cause is multi-factorial, likely related to autoimmune disorder, liver disease and splenomegaly. She is not symptomatic. Observe for now.   Orders Placed This Encounter  Procedures   CBC with Differential/Platelet    Standing Status:   Standing    Number of Occurrences:   22    Standing Expiration Date:   11/24/2021   Comprehensive metabolic panel    Standing Status:   Standing    Number of Occurrences:   33    Standing Expiration Date:   11/24/2021   Kappa/lambda light chains    Standing Status:   Standing    Number of Occurrences:   22    Standing Expiration Date:   11/24/2021   Multiple Myeloma Panel (SPEP&IFE w/QIG)    Standing Status:   Standing    Number of Occurrences:   22    Standing Expiration Date:   11/24/2021    All questions were answered. The patient knows to call the clinic with any problems, questions or concerns. The total time spent in the appointment was 20 minutes encounter with patients including review of chart and various tests results, discussions about plan of care and coordination of care plan   Heath Lark, MD 11/24/2020 10:33 AM  INTERVAL HISTORY: Please see below for problem oriented charting. She returns for further follow-up She is doing well Her joint pain is well controlled with Morrie Sheldon with occasional prednisone She  admits she has not been compliant with vitamin D and calcium supplement No recent infection, fever or chills  SUMMARY OF ONCOLOGIC HISTORY:  [name] is here because of abnormal blood work. She has rheumatoid arthritis and is undergoing chronic treatment. She was treated with methotrexate in the past, over 15 years ago. She has been on intermittent doses of prednisone and prior treatment with Enbrel. Most recently, she was started on Orencia. Her rheumatologist order SPEP which came back abnormal and hence she is referred here. The patient was under the impression she is being referred here because of abnormalities in the liver. On further review, she was noted to have abnormal liver function tests. She had imaging study done in her abdomen many years ago which show evidence of splenomegaly. She is not aware of prior diagnosis of hepatitis. On further review of her blood work, she is also noted to have thrombocytopenia.The patient denies any recent signs or symptoms of bleeding such as spontaneous epistaxis, hematuria or hematochezia. She has never received transfusions before.  She denies history of abnormal bone pain or bone fracture. Patient denies history of recurrent infection or atypical infections such as shingles of meningitis. Denies chills, night sweats, anorexia or abnormal weight loss. She had history of seizure disorder but has not taken any medications recently. 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, on observation   REVIEW OF SYSTEMS:   Constitutional: Denies  fevers, chills or abnormal weight loss Eyes: Denies blurriness of vision Ears, nose, mouth, throat, and face: Denies mucositis or sore throat Respiratory: Denies cough, dyspnea or wheezes Cardiovascular: Denies palpitation, chest discomfort or lower extremity swelling Gastrointestinal:  Denies nausea, heartburn or change in bowel habits Skin: Denies  abnormal skin rashes Lymphatics: Denies new lymphadenopathy or easy bruising Neurological:Denies numbness, tingling or new weaknesses Behavioral/Psych: Mood is stable, no new changes  All other systems were reviewed with the patient and are negative.  I have reviewed the past medical history, past surgical history, social history and family history with the patient and they are unchanged from previous note.  ALLERGIES:  is allergic to celebrex [celecoxib] and elemental sulfur.  MEDICATIONS:  Current Outpatient Medications  Medication Sig Dispense Refill   predniSONE (DELTASONE) 5 MG tablet Take 5 mg by mouth daily as needed (flare up). Reported on 09/12/2015  0   XELJANZ XR 11 MG TB24 Take 11 mg by mouth daily.     No current facility-administered medications for this visit.    PHYSICAL EXAMINATION: ECOG PERFORMANCE STATUS: 0 - Asymptomatic  Vitals:   11/24/20 0847  BP: (!) 128/94  Pulse: 95  Resp: 18  Temp: (!) 97.4 F (36.3 C)  SpO2: 100%   Filed Weights   11/24/20 0847  Weight: 151 lb 9.6 oz (68.8 kg)    GENERAL:alert, no distress and comfortable SKIN: skin color, texture, turgor are normal, no rashes or significant lesions EYES: normal, Conjunctiva are pink and non-injected, sclera clear OROPHARYNX:no exudate, no erythema and lips, buccal mucosa, and tongue normal  NECK: supple, thyroid normal size, non-tender, without nodularity LYMPH:  no palpable lymphadenopathy in the cervical, axillary or inguinal LUNGS: clear to auscultation and percussion with normal breathing effort HEART: regular rate & rhythm and no murmurs and no lower extremity edema ABDOMEN:abdomen soft, non-tender and normal bowel sounds Musculoskeletal:no cyanosis of digits and no clubbing  NEURO: alert & oriented x 3 with fluent speech, no focal motor/sensory deficits  LABORATORY DATA:  I have reviewed the data as listed    Component Value Date/Time   NA 142 11/17/2020 0824   NA 137 06/01/2020  1531   NA 143 08/18/2015 1156   K 3.9 11/17/2020 0824   K 3.5 08/18/2015 1156   CL 108 11/17/2020 0824   CO2 23 11/17/2020 0824   CO2 26 08/18/2015 1156   GLUCOSE 99 11/17/2020 0824   GLUCOSE 107 08/18/2015 1156   BUN 12 11/17/2020 0824   BUN 13 06/01/2020 1531   BUN 12.4 08/18/2015 1156   CREATININE 0.76 11/17/2020 0824   CREATININE 0.9 08/18/2015 1156   CALCIUM 9.0 11/17/2020 0824   CALCIUM 9.1 08/18/2015 1156   PROT 7.6 11/17/2020 0824   PROT 8.2 06/01/2020 1531   PROT 7.7 08/18/2015 1156   ALBUMIN 4.1 11/17/2020 0824   ALBUMIN 5.1 (H) 06/01/2020 1531   ALBUMIN 4.0 08/18/2015 1156   AST 26 11/17/2020 0824   AST 66 (H) 08/18/2015 1156   ALT 38 11/17/2020 0824   ALT 117 (H) 08/18/2015 1156   ALKPHOS 72 11/17/2020 0824   ALKPHOS 70 08/18/2015 1156   BILITOT 1.1 11/17/2020 0824   BILITOT 1.8 (H) 06/01/2020 1531   BILITOT 1.00 08/18/2015 1156   GFRNONAA >60 11/17/2020 0824   GFRAA 91 06/01/2020 1531    No results found for: SPEP, UPEP  Lab Results  Component Value Date   WBC 4.9 11/17/2020   NEUTROABS 2.7 11/17/2020   HGB 14.5 11/17/2020  HCT 39.2 11/17/2020   MCV 87.9 11/17/2020   PLT 140 (L) 11/17/2020      Chemistry      Component Value Date/Time   NA 142 11/17/2020 0824   NA 137 06/01/2020 1531   NA 143 08/18/2015 1156   K 3.9 11/17/2020 0824   K 3.5 08/18/2015 1156   CL 108 11/17/2020 0824   CO2 23 11/17/2020 0824   CO2 26 08/18/2015 1156   BUN 12 11/17/2020 0824   BUN 13 06/01/2020 1531   BUN 12.4 08/18/2015 1156   CREATININE 0.76 11/17/2020 0824   CREATININE 0.9 08/18/2015 1156      Component Value Date/Time   CALCIUM 9.0 11/17/2020 0824   CALCIUM 9.1 08/18/2015 1156   ALKPHOS 72 11/17/2020 0824   ALKPHOS 70 08/18/2015 1156   AST 26 11/17/2020 0824   AST 66 (H) 08/18/2015 1156   ALT 38 11/17/2020 0824   ALT 117 (H) 08/18/2015 1156   BILITOT 1.1 11/17/2020 0824   BILITOT 1.8 (H) 06/01/2020 1531   BILITOT 1.00 08/18/2015 1156

## 2020-11-24 NOTE — Assessment & Plan Note (Signed)
The cause is multi-factorial, likely related to autoimmune disorder, liver disease and splenomegaly. She is not symptomatic. Observe for now.

## 2020-11-24 NOTE — Assessment & Plan Note (Signed)
So far, she has no signs of clinical progression I briefly spent some time educating the patient natural history of MGUS. I plan to see her back in 12 months for repeat history and blood work. We also discussed the importance of calcium and vitamin D supplementation

## 2020-12-13 ENCOUNTER — Encounter: Payer: Self-pay | Admitting: Family Medicine

## 2020-12-13 ENCOUNTER — Other Ambulatory Visit: Payer: Self-pay

## 2020-12-13 ENCOUNTER — Ambulatory Visit (INDEPENDENT_AMBULATORY_CARE_PROVIDER_SITE_OTHER): Payer: Managed Care, Other (non HMO) | Admitting: Family Medicine

## 2020-12-13 VITALS — BP 121/72 | HR 66 | Ht 64.0 in | Wt 153.0 lb

## 2020-12-13 DIAGNOSIS — H9193 Unspecified hearing loss, bilateral: Secondary | ICD-10-CM

## 2020-12-13 DIAGNOSIS — H6121 Impacted cerumen, right ear: Secondary | ICD-10-CM | POA: Diagnosis not present

## 2020-12-13 DIAGNOSIS — H9190 Unspecified hearing loss, unspecified ear: Secondary | ICD-10-CM | POA: Insufficient documentation

## 2020-12-13 NOTE — Progress Notes (Signed)
    SUBJECTIVE:   CHIEF COMPLAINT / HPI:    Hearing loss: The patient presents with left hearing loss for many (3-4) years and now has reduced hearing in her right ear which started a few weeks ago. She denies trauma and has no ear drainage or pain. No fever. Denies dizziness, loss of balance or vision change.   PERTINENT  PMH / PSH: PMX reviewed  OBJECTIVE:   BP 121/72   Pulse 66   Ht '5\' 4"'$  (1.626 m)   Wt 153 lb (69.4 kg)   SpO2 99%   BMI 26.26 kg/m   Physical Exam Vitals reviewed.  HENT:     Head: Normocephalic.     Right Ear: There is impacted cerumen.     Left Ear: Tympanic membrane, ear canal and external ear normal. There is no impacted cerumen.     Ears:     Comments: Hearing Screening  Right ear:    '1000Hz'$ :  Pass   '4000Hz'$ :  Pass  Left ear: Failed   Cardiovascular:     Rate and Rhythm: Normal rate and regular rhythm.     Heart sounds: Normal heart sounds. No murmur heard. Pulmonary:     Effort: Pulmonary effort is normal. No respiratory distress.     Breath sounds: Normal breath sounds. No wheezing.     ASSESSMENT/PLAN:   Hearing loss Left chronic hearing loss. Subacute right ear hearing loss with cerumen impaction. Ear lavage completed with warm saline, portion of the cerumen removed with soft ear curette and the rest by lavage after obtaining verbal consent. Slight improvement of her hearing after lavage. Will likely benefit from a hearing aid.  Referral to ENT discussed and this was ordered. F/U as needed.     Andrena Mews, MD [place]

## 2020-12-13 NOTE — Assessment & Plan Note (Signed)
Left chronic hearing loss. Subacute right ear hearing loss with cerumen impaction. Ear lavage completed with warm saline, portion of the cerumen removed with soft ear curette and the rest by lavage after obtaining verbal consent. Slight improvement of her hearing after lavage. Will likely benefit from a hearing aid.  Referral to ENT discussed and this was ordered. F/U as needed.

## 2020-12-13 NOTE — Patient Instructions (Signed)
Hearing Loss ?Hearing loss is a partial or total loss of the ability to hear. This can be temporary or permanent, and it can happen in one or both ears. ?Medical care is necessary to treat hearing loss properly and to prevent the condition from getting worse. Your hearing may partially or completely come back, depending on what caused your hearing loss and how severe it is. In some cases, hearing loss is permanent. ?What are the causes? ?Common causes of hearing loss include: ?Too much wax in the ear canal. ?Infection of the ear canal or middle ear. ?Fluid in the middle ear. ?Injury to the ear or surrounding area. ?An object stuck in the ear. ?A history of prolonged exposure to loud sounds, such as music. ?Less common causes of hearing loss include: ?Tumors in the ear. ?Viral or bacterial infections, such as meningitis. ?A hole in the eardrum (perforated eardrum). ?Problems with the hearing nerve that sends signals between the brain and the ear. ?Certain medicines. ?What are the signs or symptoms? ?Symptoms of this condition may include: ?Difficulty telling the difference between sounds. ?Difficulty following a conversation when there is background noise. ?Lack of response to sounds in your environment. This may be most noticeable when you do not respond to startling sounds. ?Needing to turn up the volume on the television, radio, or other devices. ?Ringing in the ears. ?Dizziness. ?How is this diagnosed? ?This condition is diagnosed based on: ?A physical exam. ?A hearing test (audiometry). The audiometry test will be performed by a hearing specialist (audiologist). ?You may also be referred to an ear, nose, and throat (ENT) specialist (otolaryngologist). ?How is this treated? ?Treatment for hearing loss may include: ?Ear wax removal. ?Medicines to treat or prevent infection (antibiotics). ?Medicines to reduce inflammation (corticosteroids). ?Hearing aids for hearing loss related to nerve damage. ?Follow these  instructions at home: ?If you were prescribed an antibiotic medicine, take it as told by your health care provider. Do not stop taking the antibiotic even if you start to feel better. ?Take over-the-counter and prescription medicines only as told by your health care provider. ?Avoid loud noises. ?Return to your normal activities as told by your health care provider. Ask your health care provider what activities are safe for you. ?Keep all follow-up visits as told by your health care provider. This is important. ?Contact a health care provider if: ?You feel dizzy. ?You develop new symptoms. ?You vomit or feel nauseous. ?You have a fever. ?Get help right away if: ?You develop sudden changes in your vision. ?You have severe ear pain. ?You have new or increased weakness. ?You have a severe headache. ?Summary ?Hearing loss is a decreased ability to hear sounds around you. It can be temporary or permanent. ?Treatment will depend on the cause of your hearing loss. It may include ear wax removal, medicines, or a hearing aid. ?Your hearing may partially or completely come back, depending on what caused your hearing loss and how severe it is. ?Keep all follow-up visits as told by your health care provider. This is important. ?This information is not intended to replace advice given to you by your health care provider. Make sure you discuss any questions you have with your health care provider. ?Document Revised: 01/28/2018 Document Reviewed: 01/28/2018 ?Elsevier Patient Education ? 2022 [employer] ? ?

## 2020-12-19 ENCOUNTER — Telehealth: Payer: Self-pay | Admitting: *Deleted

## 2020-12-19 NOTE — Addendum Note (Signed)
Addended by: Valerie Roys on: 12/19/2020 03:03 PM   Modules accepted: Orders

## 2020-12-19 NOTE — Telephone Encounter (Signed)
Patient called stating that a referral was placed at her visit on 8/2 for ENT.  Chart reviewed and referral was mentioned but not placed.  Addendum made to the office note and referral placed.  Will send to MD as an FYI.  Gleen Ripberger,CMA

## 2021-01-31 DIAGNOSIS — L0293 Carbuncle, unspecified: Secondary | ICD-10-CM | POA: Diagnosis not present

## 2021-01-31 DIAGNOSIS — D472 Monoclonal gammopathy: Secondary | ICD-10-CM | POA: Diagnosis not present

## 2021-01-31 DIAGNOSIS — M0589 Other rheumatoid arthritis with rheumatoid factor of multiple sites: Secondary | ICD-10-CM | POA: Diagnosis not present

## 2021-01-31 DIAGNOSIS — E663 Overweight: Secondary | ICD-10-CM | POA: Diagnosis not present

## 2021-01-31 DIAGNOSIS — R7989 Other specified abnormal findings of blood chemistry: Secondary | ICD-10-CM | POA: Diagnosis not present

## 2021-01-31 DIAGNOSIS — Z6827 Body mass index (BMI) 27.0-27.9, adult: Secondary | ICD-10-CM | POA: Diagnosis not present

## 2021-02-01 ENCOUNTER — Other Ambulatory Visit: Payer: Self-pay

## 2021-02-01 ENCOUNTER — Ambulatory Visit (INDEPENDENT_AMBULATORY_CARE_PROVIDER_SITE_OTHER): Payer: Managed Care, Other (non HMO) | Admitting: Otolaryngology

## 2021-02-01 DIAGNOSIS — H9072 Mixed conductive and sensorineural hearing loss, unilateral, left ear, with unrestricted hearing on the contralateral side: Secondary | ICD-10-CM | POA: Diagnosis not present

## 2021-02-01 NOTE — Progress Notes (Signed)
HPI: [name] is a 55 y.o. female who presents is referred by by her PCP for evaluation of left ear hearing loss.  Patient has history of rheumatoid arthritis.  She states that about 2 years ago she passed out while at home thinking that this might of been caused by a medication change.  When she woke up she noticed decreased hearing in the left ear.  She saw a neurologist but never really followed up with a neurologist concerning further evaluation of this.  She did have a hearing test a while back that showed hearing loss in the left ear and they recommended hearing aid.  But she never got a hearing aid. She has not noted any significant change in her hearing since that time but still notices decreased hearing in the left ear.. On review of her records here she had a hearing test performed with [facility] 5 years ago that showed a significant mixed hearing loss in the left ear at that time.  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   Rheumatoid arthritis ([place])    Splenomegaly 08/18/2015   Vitamin B12 deficiency 08/18/2015   Past Surgical History:  Procedure Laterality Date   ECTOPIC PREGNANCY SURGERY  1990's   HIP SURGERY Bilateral 2000's   "had fluid in them"   LAPAROSCOPIC SALPINGO OOPHERECTOMY Left 12/30/2013   Procedure: LEFT SALPINGOOPHORECTOMY;  Surgeon: Lavonia Drafts, MD;  Location: [facility];  Service: Gynecology;  Laterality: Left;  BILATERAL SALPINGECTOMY   LAPAROSCOPIC SALPINGO OOPHERECTOMY Right 2000   SALPINGOOPHORECTOMY  1990's   "related to ectopic pregnancy"   Social History   Socioeconomic History   Marital status: Married    Spouse name: Not on file   Number of children: 1   Years of education: Not on file   Highest education level: Not on file  Occupational History   Occupation: Counselor/Coach    Employer: [employer]   Tobacco Use   Smoking status: Never   Smokeless tobacco: Never  Substance and Sexual Activity   Alcohol use: No    Alcohol/week: 0.0 standard drinks   Drug use: Yes    Types: Marijuana    Comment: 1 jt per day at night   Sexual activity: Not Currently  Other Topics Concern   Not on file  Social History Narrative   Not on file   Social Determinants of Health   Financial Resource Strain: Not on file  Food Insecurity: Not on file  Transportation Needs: Not on file  Physical Activity: Not on file  Stress: Not on file  Social Connections: Not on file   Family History  Problem Relation Age of Onset   Arthritis Mother    Hypertension Mother    Cancer Maternal Uncle        lung ca   Cancer Maternal Grandmother        nose & brain cancer   Allergies  Allergen Reactions   Celebrex [Celecoxib] Hives   Elemental Sulfur Hives   Prior to Admission medications   Medication Sig Start Date End Date Taking? Authorizing Provider  predniSONE (DELTASONE) 5 MG tablet Take 5 mg by mouth daily as needed (flare up). Reported on 09/12/2015 04/18/15   [provider]  XELJANZ XR 11 MG TB24 Take 11 mg by mouth daily. 11/03/18   [provider]     Positive  ROS: Otherwise negative  All other systems have been reviewed and were otherwise negative with the exception of those mentioned in the HPI and as above.  Physical Exam: Constitutional: Alert, well-appearing, no acute distress Ears: External ears without lesions or tenderness. Ear canals are clear bilaterally with minimal wax buildup that was cleaned with curette.  TMs were clear bilaterally.  On tuning fork testing BC was graded AC on the left side and AC > BC on the right side. Nasal: External nose without lesions. . Clear nasal passages Oral: Lips and gums without lesions. Tongue and palate mucosa without lesions. Posterior oropharynx clear. Neck: No palpable adenopathy or masses Respiratory: Breathing comfortably   Skin: No facial/neck lesions or rash noted.  Audiologic testing demonstrated a mild high-frequency sensorineural hearing loss in the right ear.  And a large mixed hearing loss in the left ear.  The bone scores have not changed significantly over the past 5 years however she has slightly more progression of the conductive component of her hearing loss in the left ear.  She had type A tympanograms bilaterally. SRT's were 5 DB on the right and 70 DB on the left.  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  Plan: Reviewed with her concerning conductive hearing loss in the left ear which is gradually gotten worse over the past 5years.  However she has significant sensorineural hearing loss with poor bone scores in the left ear and would require hearing aid regardless.  If the hearing aid is not effective she could consider ossicular surgery to improve the conductive component in the left ear that measures approximately 30 -35 dB.   Radene Journey, MD   CC:

## 2021-02-02 ENCOUNTER — Encounter (INDEPENDENT_AMBULATORY_CARE_PROVIDER_SITE_OTHER): Payer: Self-pay

## 2021-05-31 ENCOUNTER — Telehealth: Payer: Self-pay

## 2021-05-31 DIAGNOSIS — U071 COVID-19: Secondary | ICD-10-CM

## 2021-05-31 MED ORDER — NIRMATRELVIR/RITONAVIR (PAXLOVID)TABLET: 3.0000 | ORAL_TABLET | Freq: Two times a day (BID) | ORAL | 0 refills | Status: AC

## 2021-05-31 NOTE — Telephone Encounter (Signed)
Patient calls nurse line reporting positive covid test this morning. Patient reports symptom onset was yesterday with headaches and congestion. Patient denies fevers, body aches, cough, GI upset or SOB.   Conservative measures given to patient (warm teas with honey) and OTC medication recommendations. Red flags given.  Patient is interested in antiviral therapy.   Will forward to PCP for advisement.

## 2021-05-31 NOTE — Telephone Encounter (Signed)
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 Endorses head congestion, headache, mucus production. Denies CP, dyspnea, vomiting. Has reduced appetite but pushing fluids. Takes only prednisone and xeljanz for RA. Has multiple comorbidities including MGUS, RA, and overweight status. Discussed benefits, risks, adverse events. Rx sent to pharmacy.   Dorris Singh, MD  Family Medicine Teaching Service

## 2021-06-15 ENCOUNTER — Other Ambulatory Visit: Payer: Self-pay

## 2021-06-15 ENCOUNTER — Ambulatory Visit (INDEPENDENT_AMBULATORY_CARE_PROVIDER_SITE_OTHER): Payer: Medicare Other | Admitting: Family Medicine

## 2021-06-15 VITALS — BP 138/76 | HR 75 | Ht 64.0 in | Wt 164.8 lb

## 2021-06-15 DIAGNOSIS — S29012A Strain of muscle and tendon of back wall of thorax, initial encounter: Secondary | ICD-10-CM | POA: Diagnosis not present

## 2021-06-15 MED ORDER — NAPROXEN 500 MG PO TABS: 500.0000 mg | ORAL_TABLET | Freq: Two times a day (BID) | ORAL | 0 refills | Status: DC

## 2021-06-15 NOTE — Assessment & Plan Note (Signed)
Patient with 2 weeks of pain in left lateral ribcage area. Pain fairly mild at baseline but worse with sneezing, coughing, and certain movements. Exam concerning for muscular strain (likely of intercostal muscles or serratus) given tenderness to palpation on exam and mechanism of injury (using turn-crank prior to pain onset). No concern for cardiac or respiratory etiology at this time. No rash to suggest shingles. -Rx sent for Naproxen 500mg  BID. Of note, patient has gotten a rash (described as skin boils) with Celebrex in the past but states she has tolerated Naproxen fine. No hx of severe allergic reactions. -Advised supportive care with heat/ice/rest -Return precautions reviewed -If no improvement, can consider imaging in the future

## 2021-06-15 NOTE — Patient Instructions (Addendum)
It was great to see you!  It sounds like you pulled a muscle that attaches to your rib cage. These can take quite a while to heal because small daily movements (sneezing, coughing, sitting up, turning etc) requires you to use these muscles.  We will try a course of anti-inflammatory medication called Naproxen. Take 1 tablet twice daily for the next 7 days. You should also use heat or ice for 15 minutes 3x/day for the next several days.  If there is no improvement after another 2 weeks, we will consider imaging at that time.  Let us know if you develop any concerns in the meantime (trouble breathing, chest pain, new rash, etc).  Take care, Dr. Rock Nephew

## 2021-06-15 NOTE — Progress Notes (Signed)
° ° °  SUBJECTIVE:   CHIEF COMPLAINT / HPI:   Left ribcage pain Patient notes a pain in her left ribcage area-- starts in the left side of her back just under her bra-line and radiates around to front. Symptoms started 2 weeks ago after using a turn-crank to lower a heavy basketball hoop. Pain is exacerbated by palpation, sneezing, coughing, and turning a certain way.  She has a history of RA and takes Prednisone 10mg  as needed for flares. Tried prednisone for a few days sporadically which seemed to help but pain recurred on the days she didn't take prednisone. Has not tried heat, ice, NSAIDs or other things for relief.  Patient denies chest pain, shortness of breath, fever, cough, etc. Of note, she did have COVID recently but that presented mostly as a headache. This pain started prior to her diagnosis of COVID.  Patient continues to walk her dog for 1 hr each morning without difficulty.  PERTINENT  PMH / PSH: rheumatoid arthritis, seizure disorder  OBJECTIVE:   BP 138/76    Pulse 75    Ht 5\' 4"  (1.626 m)    Wt 164 lb 12.8 oz (74.8 kg)    SpO2 98%    BMI 28.29 kg/m   Gen: alert, well-appearing, NAD CV: RRR, normal S1/S2 without m/r/g Resp: normal effort, breath sounds equal bilaterally, lungs CTAB MSK: tenderness to palpation along left lateral ribcage (approximately 6th-8th ribs). Full ROM with back flexion, extension, rotation, and lateral bending. No chest wall tenderness. Neuro: grossly intact Psych: appropriate mood and affect Skin: no rashes or lesions  ASSESSMENT/PLAN:   Muscle strain of left upper back Patient with 2 weeks of pain in left lateral ribcage area. Pain fairly mild at baseline but worse with sneezing, coughing, and certain movements. Exam concerning for muscular strain (likely of intercostal muscles or serratus) given tenderness to palpation on exam and mechanism of injury (using turn-crank prior to pain onset). No concern for cardiac or respiratory etiology at this  time. No rash to suggest shingles. -Rx sent for Naproxen 500mg  BID. Of note, patient has gotten a rash (described as skin boils) with Celebrex in the past but states she has tolerated Naproxen fine. No hx of severe allergic reactions. -Advised supportive care with heat/ice/rest -Return precautions reviewed -If no improvement, can consider imaging in the future     Alcus Dad, MD [place]

## 2021-07-26 DIAGNOSIS — M0589 Other rheumatoid arthritis with rheumatoid factor of multiple sites: Secondary | ICD-10-CM | POA: Diagnosis not present

## 2021-07-26 DIAGNOSIS — Z6827 Body mass index (BMI) 27.0-27.9, adult: Secondary | ICD-10-CM | POA: Diagnosis not present

## 2021-07-26 DIAGNOSIS — L0293 Carbuncle, unspecified: Secondary | ICD-10-CM | POA: Diagnosis not present

## 2021-07-26 DIAGNOSIS — R7989 Other specified abnormal findings of blood chemistry: Secondary | ICD-10-CM | POA: Diagnosis not present

## 2021-07-26 DIAGNOSIS — E663 Overweight: Secondary | ICD-10-CM | POA: Diagnosis not present

## 2021-07-26 DIAGNOSIS — D472 Monoclonal gammopathy: Secondary | ICD-10-CM | POA: Diagnosis not present

## 2021-10-17 ENCOUNTER — Encounter: Payer: Self-pay | Admitting: *Deleted

## 2021-10-26 DIAGNOSIS — Z13220 Encounter for screening for lipoid disorders: Secondary | ICD-10-CM | POA: Diagnosis not present

## 2021-10-26 DIAGNOSIS — R5382 Chronic fatigue, unspecified: Secondary | ICD-10-CM | POA: Diagnosis not present

## 2021-10-26 DIAGNOSIS — Z111 Encounter for screening for respiratory tuberculosis: Secondary | ICD-10-CM | POA: Diagnosis not present

## 2021-10-26 DIAGNOSIS — M0589 Other rheumatoid arthritis with rheumatoid factor of multiple sites: Secondary | ICD-10-CM | POA: Diagnosis not present

## 2021-10-26 DIAGNOSIS — Z6826 Body mass index (BMI) 26.0-26.9, adult: Secondary | ICD-10-CM | POA: Diagnosis not present

## 2021-10-26 DIAGNOSIS — R7989 Other specified abnormal findings of blood chemistry: Secondary | ICD-10-CM | POA: Diagnosis not present

## 2021-10-26 DIAGNOSIS — E663 Overweight: Secondary | ICD-10-CM | POA: Diagnosis not present

## 2021-10-26 DIAGNOSIS — Z79899 Other long term (current) drug therapy: Secondary | ICD-10-CM | POA: Diagnosis not present

## 2021-10-26 DIAGNOSIS — D472 Monoclonal gammopathy: Secondary | ICD-10-CM | POA: Diagnosis not present

## 2021-11-17 ENCOUNTER — Inpatient Hospital Stay: Payer: Managed Care, Other (non HMO) | Attending: Hematology and Oncology

## 2021-11-17 ENCOUNTER — Other Ambulatory Visit: Payer: Self-pay

## 2021-11-17 DIAGNOSIS — D472 Monoclonal gammopathy: Secondary | ICD-10-CM | POA: Insufficient documentation

## 2021-11-17 DIAGNOSIS — D61818 Other pancytopenia: Secondary | ICD-10-CM | POA: Diagnosis not present

## 2021-11-17 LAB — CBC WITH DIFFERENTIAL/PLATELET
Abs Immature Granulocytes: 0.01 10*3/uL (ref 0.00–0.07)
Basophils Absolute: 0 10*3/uL (ref 0.0–0.1)
Basophils Relative: 1 %
Eosinophils Absolute: 0.1 10*3/uL (ref 0.0–0.5)
Eosinophils Relative: 2 %
HCT: 37.6 % (ref 36.0–46.0)
Hemoglobin: 13.9 g/dL (ref 12.0–15.0)
Immature Granulocytes: 0 %
Lymphocytes Relative: 36 %
Lymphs Abs: 1.4 10*3/uL (ref 0.7–4.0)
MCH: 32.7 pg (ref 26.0–34.0)
MCHC: 37 g/dL — ABNORMAL HIGH (ref 30.0–36.0)
MCV: 88.5 fL (ref 80.0–100.0)
Monocytes Absolute: 0.3 10*3/uL (ref 0.1–1.0)
Monocytes Relative: 9 %
Neutro Abs: 2 10*3/uL (ref 1.7–7.7)
Neutrophils Relative %: 52 %
Platelets: 123 10*3/uL — ABNORMAL LOW (ref 150–400)
RBC: 4.25 MIL/uL (ref 3.87–5.11)
RDW: 11.9 % (ref 11.5–15.5)
WBC: 3.8 10*3/uL — ABNORMAL LOW (ref 4.0–10.5)
nRBC: 0 % (ref 0.0–0.2)

## 2021-11-17 LAB — COMPREHENSIVE METABOLIC PANEL
ALT: 27 U/L (ref 0–44)
AST: 23 U/L (ref 15–41)
Albumin: 4.1 g/dL (ref 3.5–5.0)
Alkaline Phosphatase: 74 U/L (ref 38–126)
Anion gap: 7 (ref 5–15)
BUN: 15 mg/dL (ref 6–20)
CO2: 24 mmol/L (ref 22–32)
Calcium: 8.9 mg/dL (ref 8.9–10.3)
Chloride: 109 mmol/L (ref 98–111)
Creatinine, Ser: 0.72 mg/dL (ref 0.44–1.00)
GFR, Estimated: >60 mL/min (ref >60)
Glucose, Bld: 99 mg/dL (ref 70–99)
Potassium: 3.8 mmol/L (ref 3.5–5.1)
Sodium: 140 mmol/L (ref 135–145)
Total Bilirubin: 1 mg/dL (ref 0.3–1.2)
Total Protein: 7.3 g/dL (ref 6.5–8.1)

## 2021-11-20 LAB — KAPPA/LAMBDA LIGHT CHAINS
Kappa free light chain: 28.2 mg/L — ABNORMAL HIGH (ref 3.3–19.4)
Kappa, lambda light chain ratio: 0.07 — ABNORMAL LOW (ref 0.26–1.65)
Lambda free light chains: 404.5 mg/L — ABNORMAL HIGH (ref 5.7–26.3)

## 2021-11-21 LAB — MULTIPLE MYELOMA PANEL, SERUM
Albumin SerPl Elph-Mcnc: 3.7 g/dL (ref 2.9–4.4)
Albumin/Glob SerPl: 1.2 (ref 0.7–1.7)
Alpha 1: 0.2 g/dL (ref 0.0–0.4)
Alpha2 Glob SerPl Elph-Mcnc: 0.4 g/dL (ref 0.4–1.0)
B-Globulin SerPl Elph-Mcnc: 1.4 g/dL — ABNORMAL HIGH (ref 0.7–1.3)
Gamma Glob SerPl Elph-Mcnc: 1.1 g/dL (ref 0.4–1.8)
Globulin, Total: 3.1 g/dL (ref 2.2–3.9)
IgA: 691 mg/dL — ABNORMAL HIGH (ref 87–352)
IgG (Immunoglobin G), Serum: 1117 mg/dL (ref 586–1602)
IgM (Immunoglobulin M), Srm: 123 mg/dL (ref 26–217)
M Protein SerPl Elph-Mcnc: 0.5 g/dL — ABNORMAL HIGH
Total Protein ELP: 6.8 g/dL (ref 6.0–8.5)

## 2021-11-24 ENCOUNTER — Encounter: Payer: Self-pay | Admitting: Hematology and Oncology

## 2021-11-24 ENCOUNTER — Inpatient Hospital Stay (HOSPITAL_BASED_OUTPATIENT_CLINIC_OR_DEPARTMENT_OTHER): Payer: Managed Care, Other (non HMO) | Admitting: Hematology and Oncology

## 2021-11-24 DIAGNOSIS — D61818 Other pancytopenia: Secondary | ICD-10-CM | POA: Insufficient documentation

## 2021-11-24 DIAGNOSIS — D472 Monoclonal gammopathy: Secondary | ICD-10-CM

## 2021-11-24 NOTE — Assessment & Plan Note (Signed)
So far, she has no signs of clinical progression I briefly spent some time educating the patient natural history of MGUS. I plan to see her back in 12 months for repeat history and blood work.

## 2021-11-24 NOTE — Progress Notes (Signed)
HEMATOLOGY-ONCOLOGY ELECTRONIC VISIT PROGRESS NOTE  Patient Care Team: Martyn Malay, MD as PCP - General (Family Medicine) Hennie Duos, MD as Consulting Physician (Rheumatology) Heath Lark, MD as Consulting Physician (Hematology and Oncology)  I connected with the patient via telephone conference and verified that I am speaking with the correct person using two identifiers. The patient's location is at home and I am providing care from the [facility] I discussed the limitations, risks, security and privacy concerns of performing an evaluation and management service by e-visits and the availability of in person appointments.  I also discussed with the patient that there may be a patient responsible charge related to this service. The patient expressed understanding and agreed to proceed.   ASSESSMENT & PLAN:  MGUS (monoclonal gammopathy of unknown significance) So far, she has no signs of clinical progression I briefly spent some time educating the patient natural history of MGUS. I plan to see her back in 12 months for repeat history and blood work.  Pancytopenia, acquired [facility]) She has mild intermittent pancytopenia This could be related to her autoimmune condition and history of splenomegaly Observe only She is not symptomatic  Orders Placed This Encounter  Procedures   CMP ([place] only)    Standing Status:   Future    Standing Expiration Date:   11/25/2022   CBC with Differential ([place] Only)    Standing Status:   Future    Standing Expiration Date:   11/25/2022    INTERVAL HISTORY: Please see below for problem oriented charting. The purpose of today's discussion is to review test results She was being followed for MGUS Overall, she has no new medical illness since last year She continues to take medications for chronic arthritis, stable  SUMMARY OF ONCOLOGIC HISTORY:  [name] is here because of abnormal blood work. She has  rheumatoid arthritis and is undergoing chronic treatment. She was treated with methotrexate in the past, over 15 years ago. She has been on intermittent doses of prednisone and prior treatment with Enbrel. Most recently, she was started on Orencia. Her rheumatologist order SPEP which came back abnormal and hence she is referred here. The patient was under the impression she is being referred here because of abnormalities in the liver. On further review, she was noted to have abnormal liver function tests. She had imaging study done in her abdomen many years ago which show evidence of splenomegaly. She is not aware of prior diagnosis of hepatitis. On further review of her blood work, she is also noted to have thrombocytopenia.The patient denies any recent signs or symptoms of bleeding such as spontaneous epistaxis, hematuria or hematochezia. She has never received transfusions before.  She denies history of abnormal bone pain or bone fracture. Patient denies history of recurrent infection or atypical infections such as shingles of meningitis. Denies chills, night sweats, anorexia or abnormal weight loss. She had history of seizure disorder but has not taken any medications recently. 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, on observation  REVIEW OF SYSTEMS:   Constitutional: Denies fevers, chills or abnormal weight loss Eyes: Denies blurriness of vision Ears, nose, mouth, throat, and face: Denies mucositis or sore throat Respiratory: Denies cough, dyspnea or wheezes Cardiovascular: Denies palpitation, chest discomfort Gastrointestinal:  Denies nausea, heartburn or change in bowel habits Skin: Denies abnormal skin rashes Lymphatics: Denies new lymphadenopathy or easy bruising Neurological:Denies numbness, tingling or new  weaknesses Behavioral/Psych: Mood is stable, no new changes  Extremities: No lower extremity edema All other systems  were reviewed with the patient and are negative.  I have reviewed the past medical history, past surgical history, social history and family history with the patient and they are unchanged from previous note.  ALLERGIES:  is allergic to celebrex [celecoxib] and elemental sulfur.  MEDICATIONS:  Current Outpatient Medications  Medication Sig Dispense Refill   naproxen (NAPROSYN) 500 MG tablet Take 1 tablet (500 mg total) by mouth 2 (two) times daily with a meal. 20 tablet 0   predniSONE (DELTASONE) 5 MG tablet Take 5 mg by mouth daily as needed (flare up). Reported on 09/12/2015  0   XELJANZ XR 11 MG TB24 Take 11 mg by mouth daily.     No current facility-administered medications for this visit.    PHYSICAL EXAMINATION: ECOG PERFORMANCE STATUS: 1 - Symptomatic but completely ambulatory  LABORATORY DATA:  I have reviewed the data as listed    Latest Ref Rng & Units 11/17/2021    8:15 AM 11/17/2020    8:24 AM 06/01/2020    3:31 PM  CMP  Glucose 70 - 99 mg/dL 99  99  107   BUN 6 - 20 mg/dL '15  12  13   '$ Creatinine 0.44 - 1.00 mg/dL 0.72  0.76  0.84   Sodium 135 - 145 mmol/L 140  142  137   Potassium 3.5 - 5.1 mmol/L 3.8  3.9  3.9   Chloride 98 - 111 mmol/L 109  108  102   CO2 22 - 32 mmol/L '24  23  20   '$ Calcium 8.9 - 10.3 mg/dL 8.9  9.0  9.6   Total Protein 6.5 - 8.1 g/dL 7.3  7.6  8.2   Total Bilirubin 0.3 - 1.2 mg/dL 1.0  1.1  1.8   Alkaline Phos 38 - 126 U/L 74  72  99   AST 15 - 41 U/L 23  26  39   ALT 0 - 44 U/L 27  38  53     Lab Results  Component Value Date   WBC 3.8 (L) 11/17/2021   HGB 13.9 11/17/2021   HCT 37.6 11/17/2021   MCV 88.5 11/17/2021   PLT 123 (L) 11/17/2021   NEUTROABS 2.0 11/17/2021   I discussed the assessment and treatment plan with the patient. The patient was provided an opportunity to ask questions and all were answered. The patient agreed with the plan and demonstrated an understanding of the instructions. The patient was advised to call back or seek  an in-person evaluation if the symptoms worsen or if the condition fails to improve as anticipated.    I spent 20 minutes for the appointment reviewing test results, discuss management and coordination of care.  Heath Lark, MD 11/24/2021 8:25 AM

## 2021-11-24 NOTE — Assessment & Plan Note (Signed)
She has mild intermittent pancytopenia This could be related to her autoimmune condition and history of splenomegaly Observe only She is not symptomatic

## 2022-02-01 DIAGNOSIS — Z6825 Body mass index (BMI) 25.0-25.9, adult: Secondary | ICD-10-CM | POA: Diagnosis not present

## 2022-02-01 DIAGNOSIS — E663 Overweight: Secondary | ICD-10-CM | POA: Diagnosis not present

## 2022-02-01 DIAGNOSIS — R5382 Chronic fatigue, unspecified: Secondary | ICD-10-CM | POA: Diagnosis not present

## 2022-02-01 DIAGNOSIS — M0589 Other rheumatoid arthritis with rheumatoid factor of multiple sites: Secondary | ICD-10-CM | POA: Diagnosis not present

## 2022-02-01 DIAGNOSIS — Z79899 Other long term (current) drug therapy: Secondary | ICD-10-CM | POA: Diagnosis not present

## 2022-02-01 DIAGNOSIS — Z13220 Encounter for screening for lipoid disorders: Secondary | ICD-10-CM | POA: Diagnosis not present

## 2022-02-01 DIAGNOSIS — D472 Monoclonal gammopathy: Secondary | ICD-10-CM | POA: Diagnosis not present

## 2022-02-01 DIAGNOSIS — R7989 Other specified abnormal findings of blood chemistry: Secondary | ICD-10-CM | POA: Diagnosis not present

## 2022-02-01 DIAGNOSIS — Z111 Encounter for screening for respiratory tuberculosis: Secondary | ICD-10-CM | POA: Diagnosis not present

## 2022-06-18 ENCOUNTER — Emergency Department (HOSPITAL_COMMUNITY): Payer: Managed Care, Other (non HMO)

## 2022-06-18 ENCOUNTER — Emergency Department (HOSPITAL_COMMUNITY)
Admission: EM | Admit: 2022-06-18 | Discharge: 2022-06-19 | Disposition: A | Discharge status: Home / Self Care | Payer: Managed Care, Other (non HMO) | Attending: Emergency Medicine | Admitting: Emergency Medicine

## 2022-06-18 DIAGNOSIS — X58XXXA Exposure to other specified factors, initial encounter: Secondary | ICD-10-CM | POA: Insufficient documentation

## 2022-06-18 DIAGNOSIS — S20212A Contusion of left front wall of thorax, initial encounter: Secondary | ICD-10-CM | POA: Diagnosis not present

## 2022-06-18 DIAGNOSIS — F17200 Nicotine dependence, unspecified, uncomplicated: Secondary | ICD-10-CM | POA: Insufficient documentation

## 2022-06-18 DIAGNOSIS — Z8579 Personal history of other malignant neoplasms of lymphoid, hematopoietic and related tissues: Secondary | ICD-10-CM | POA: Diagnosis not present

## 2022-06-18 DIAGNOSIS — R0602 Shortness of breath: Secondary | ICD-10-CM | POA: Diagnosis not present

## 2022-06-18 DIAGNOSIS — D696 Thrombocytopenia, unspecified: Secondary | ICD-10-CM | POA: Diagnosis not present

## 2022-06-18 DIAGNOSIS — E876 Hypokalemia: Secondary | ICD-10-CM | POA: Diagnosis not present

## 2022-06-18 DIAGNOSIS — R0781 Pleurodynia: Secondary | ICD-10-CM | POA: Diagnosis present

## 2022-06-18 NOTE — ED Provider Notes (Signed)
[facility] EMERGENCY DEPARTMENT AT [facility] Provider Note   CSN: [id number] Arrival date & time: 06/18/22  1644     History {Add pertinent medical, surgical, social history, OB history to HPI:1} Chief Complaint  Patient presents with   Fall   Rib Injury    MARLINA CATALDI is a 57 y.o. female.  57 year old female with past medical history of multiple myeloma, smoker occasionally, presents with complaint of dyspnea on exertion, pleuritic pain, left-sided rib pain for the past few days.  Patient states that she rolled over in bed and felt like her rib punctured something in her chest, has had chest wall pain since that time however now with additional symptoms as above.  Denies fevers, chills, history of PE or DVT, denies unilateral leg swelling.  No other complaints or concerns.       Home Medications Prior to Admission medications   Medication Sig Start Date End Date Taking? Authorizing Provider  naproxen (NAPROSYN) 500 MG tablet Take 1 tablet (500 mg total) by mouth 2 (two) times daily with a meal. 06/15/21   Alcus Dad, MD  predniSONE (DELTASONE) 5 MG tablet Take 5 mg by mouth daily as needed (flare up). Reported on 09/12/2015 04/18/15   [provider]  XELJANZ XR 11 MG TB24 Take 11 mg by mouth daily. 11/03/18   [provider]      Allergies    Celebrex [celecoxib] and Elemental sulfur    Review of Systems   Review of Systems Negative except as per HPI Physical Exam Updated Vital Signs BP (!) 166/91   Pulse (!) 58   Temp 97.9 F (36.6 C) (Oral)   Resp 18   SpO2 97%  Physical Exam Vitals and nursing note reviewed.  Constitutional:      General: She is not in acute distress.    Appearance: She is well-developed. She is not diaphoretic.  HENT:     Head: Normocephalic and atraumatic.  Cardiovascular:     Rate and Rhythm: Normal rate and regular rhythm.     Heart sounds: Normal heart sounds.  Pulmonary:     Effort: Pulmonary  effort is normal.     Breath sounds: Normal breath sounds.  Chest:     Chest wall: Tenderness present.    Abdominal:     Palpations: Abdomen is soft.     Tenderness: There is no abdominal tenderness.  Musculoskeletal:     Cervical back: Neck supple.     Right lower leg: No edema.     Left lower leg: No edema.  Skin:    General: Skin is warm and dry.     Findings: No erythema or rash.  Neurological:     Mental Status: She is alert and oriented to person, place, and time.  Psychiatric:        Behavior: Behavior normal.     ED Results / Procedures / Treatments   Labs (all labs ordered are listed, but only abnormal results are displayed) Labs Reviewed  CBC WITH DIFFERENTIAL/PLATELET  BASIC METABOLIC PANEL    EKG None  Radiology DG Chest 2 View  Result Date: 06/18/2022 CLINICAL DATA:  Right rib pain after injury 2 days ago. EXAM: CHEST - 2 VIEW COMPARISON:  March 26, 2018. FINDINGS: The heart size and mediastinal contours are within normal limits. Both lungs are clear. The visualized skeletal structures are unremarkable. IMPRESSION: No active cardiopulmonary disease. Electronically Signed   By: Marijo Conception M.D.   On:  06/18/2022 17:41    Procedures Procedures  {Document cardiac monitor, telemetry assessment procedure when appropriate:1}  Medications Ordered in ED Medications - No data to display  ED Course/ Medical Decision Making/ A&P   {   Click here for ABCD2, HEART and other calculatorsREFRESH Note before signing :1}                          Medical Decision Making  This patient presents to the ED for concern of ***, this involves an extensive number of treatment options, and is a complaint that carries with it a high risk of complications and morbidity.  The differential diagnosis includes ***   Co morbidities that complicate the patient evaluation  ***   Additional history obtained:  Additional history obtained from *** External records from  outside source obtained and reviewed including ***   Lab Tests:  I Ordered, and personally interpreted labs.  The pertinent results include:  ***   Imaging Studies ordered:  I ordered imaging studies including ***  I independently visualized and interpreted imaging which showed *** I agree with the radiologist interpretation   Cardiac Monitoring: / EKG:  The patient was maintained on a cardiac monitor.  I personally viewed and interpreted the cardiac monitored which showed an underlying rhythm of: ***   Consultations Obtained:  I requested consultation with the ***,  and discussed lab and imaging findings as well as pertinent plan - they recommend: ***   Problem List / ED Course / Critical interventions / Medication management  *** I ordered medication including ***  for ***  Reevaluation of the patient after these medicines showed that the patient {resolved/improved/worsened:23923::"improved"} I have reviewed the patients home medicines and have made adjustments as needed   Social Determinants of Health:  ***   Test / Admission - Considered:  ***   {Document critical care time when appropriate:1} {Document review of labs and clinical decision tools ie heart score, Chads2Vasc2 etc:1}  {Document your independent review of radiology images, and any outside records:1} {Document your discussion with family members, caretakers, and with consultants:1} {Document social determinants of health affecting pt's care:1} {Document your decision making why or why not admission, treatments were needed:1} Final Clinical Impression(s) / ED Diagnoses Final diagnoses:  None    Rx / DC Orders ED Discharge Orders     None

## 2022-06-18 NOTE — ED Triage Notes (Signed)
Pt states that two days ago she was getting into bed and landed wrong, injuring her L rib cage. Pt states the pain makes it difficult to breathe.

## 2022-06-18 NOTE — ED Provider Triage Note (Signed)
Emergency Medicine Provider Triage Evaluation Note  [name] , a 57 y.o. female  was evaluated in triage.  Pt complains of left-sided rib pain.  Patient states she fell on her bed 2 days ago on her left side and hence when the pain began.  Patient states it hurts to take a deep breath or to be palpated.  Patient states she thinks she broke a bone.  Patient has RA and is on chronic steroid use.  She has allergies to ibuprofen and is unable to take Tylenol due to RA medication.  Patient denied any symptoms before this fall.  Patient denied fevers, nausea/vomiting/diarrhea, skin color changes  Review of Systems  Positive: See HPI Negative: See HPI  Physical Exam  BP (!) 192/109   Pulse 74   Temp 98.2 F (36.8 C) (Oral)   Resp 16   SpO2 97%  Gen:   Awake, no distress but appears to be in pain Resp:  Normal effort  MSK:   Moves extremities without difficulty  Other:  Tenderness to palpation when palpating L anterior ribs, no crepitus or deformities appreciated on exam, no skin color changes  Medical Decision Making  Medically screening exam initiated at 5:02 PM.  Appropriate orders placed.  [name] was informed that the remainder of the evaluation will be completed by another provider, this initial triage assessment does not replace that evaluation, and the importance of remaining in the ED until their evaluation is complete.  Workup was initiated.  Patient at this time is not in distress.   Chuck Hint, PA-C 06/18/22 1708

## 2022-06-19 ENCOUNTER — Emergency Department (HOSPITAL_COMMUNITY): Payer: Managed Care, Other (non HMO)

## 2022-06-19 ENCOUNTER — Ambulatory Visit: Payer: Medicare Other

## 2022-06-19 ENCOUNTER — Encounter (HOSPITAL_COMMUNITY): Payer: Self-pay

## 2022-06-19 DIAGNOSIS — S20212A Contusion of left front wall of thorax, initial encounter: Secondary | ICD-10-CM | POA: Diagnosis not present

## 2022-06-19 LAB — CBC WITH DIFFERENTIAL/PLATELET
Abs Immature Granulocytes: 0.04 10*3/uL (ref 0.00–0.07)
Basophils Absolute: 0 10*3/uL (ref 0.0–0.1)
Basophils Relative: 1 %
Eosinophils Absolute: 0.1 10*3/uL (ref 0.0–0.5)
Eosinophils Relative: 2 %
HCT: 39.1 % (ref 36.0–46.0)
Hemoglobin: 14.1 g/dL (ref 12.0–15.0)
Immature Granulocytes: 1 %
Lymphocytes Relative: 37 %
Lymphs Abs: 1.6 10*3/uL (ref 0.7–4.0)
MCH: 32.6 pg (ref 26.0–34.0)
MCHC: 36.1 g/dL — ABNORMAL HIGH (ref 30.0–36.0)
MCV: 90.5 fL (ref 80.0–100.0)
Monocytes Absolute: 0.5 10*3/uL (ref 0.1–1.0)
Monocytes Relative: 11 %
Neutro Abs: 2.1 10*3/uL (ref 1.7–7.7)
Neutrophils Relative %: 48 %
Platelets: 108 10*3/uL — ABNORMAL LOW (ref 150–400)
RBC: 4.32 MIL/uL (ref 3.87–5.11)
RDW: 11.9 % (ref 11.5–15.5)
WBC: 4.3 10*3/uL (ref 4.0–10.5)
nRBC: 0 % (ref 0.0–0.2)

## 2022-06-19 LAB — BASIC METABOLIC PANEL
Anion gap: 8 (ref 5–15)
BUN: 12 mg/dL (ref 6–20)
CO2: 24 mmol/L (ref 22–32)
Calcium: 8.9 mg/dL (ref 8.9–10.3)
Chloride: 105 mmol/L (ref 98–111)
Creatinine, Ser: 0.65 mg/dL (ref 0.44–1.00)
GFR, Estimated: >60 mL/min (ref >60)
Glucose, Bld: 101 mg/dL — ABNORMAL HIGH (ref 70–99)
Potassium: 3.3 mmol/L — ABNORMAL LOW (ref 3.5–5.1)
Sodium: 137 mmol/L (ref 135–145)

## 2022-06-19 MED ORDER — SODIUM CHLORIDE (PF) 0.9 % IJ SOLN
INTRAMUSCULAR | Status: AC
  Filled 2022-06-19: qty 50

## 2022-06-19 MED ORDER — IOHEXOL 350 MG/ML SOLN
100.0000 mL | Freq: Once | INTRAVENOUS | Status: AC | PRN
  Administered 2022-06-19: 100 mL via INTRAVENOUS

## 2022-06-19 NOTE — ED Notes (Signed)
Pt is ambulatory to restroom w/out assistance.

## 2022-06-19 NOTE — Discharge Instructions (Signed)
Recheck with your primary care provider.  Return to ER for new or worsening symptoms.  Use incentive spirometer.

## 2022-06-19 NOTE — ED Notes (Signed)
Pt transported to CT ?

## 2022-06-19 NOTE — Progress Notes (Deleted)
    SUBJECTIVE:   CHIEF COMPLAINT / HPI:   Patient seen in ED yesterday for pleuritic chest pain and dyspnea on exertion.  CTA, CXR ruled out PE, rib fracture, pneumonia.  PERTINENT  PMH / PSH: RA, Hx multiple myeloma  OBJECTIVE:   There were no vitals taken for this visit. ***  General: NAD, pleasant, able to participate in exam Cardiac: RRR, no murmurs. Respiratory: CTAB, normal effort, No wheezes, rales or rhonchi Abdomen: Bowel sounds present, nontender, nondistended, no hepatosplenomegaly. Extremities: no edema or cyanosis. Skin: warm and dry, no rashes noted Neuro: alert, no obvious focal deficits Psych: Normal affect and mood  ASSESSMENT/PLAN:   No problem-specific Assessment & Plan notes found for this encounter.     Dr. Precious Gilding, [place]    {    This will disappear when note is signed, click to select method of visit    :1}

## 2022-06-25 DIAGNOSIS — D472 Monoclonal gammopathy: Secondary | ICD-10-CM | POA: Diagnosis not present

## 2022-06-25 DIAGNOSIS — R7989 Other specified abnormal findings of blood chemistry: Secondary | ICD-10-CM | POA: Diagnosis not present

## 2022-06-25 DIAGNOSIS — Z6826 Body mass index (BMI) 26.0-26.9, adult: Secondary | ICD-10-CM | POA: Diagnosis not present

## 2022-06-25 DIAGNOSIS — Z13220 Encounter for screening for lipoid disorders: Secondary | ICD-10-CM | POA: Diagnosis not present

## 2022-06-25 DIAGNOSIS — Z79899 Other long term (current) drug therapy: Secondary | ICD-10-CM | POA: Diagnosis not present

## 2022-06-25 DIAGNOSIS — Z111 Encounter for screening for respiratory tuberculosis: Secondary | ICD-10-CM | POA: Diagnosis not present

## 2022-06-25 DIAGNOSIS — E663 Overweight: Secondary | ICD-10-CM | POA: Diagnosis not present

## 2022-06-25 DIAGNOSIS — M0589 Other rheumatoid arthritis with rheumatoid factor of multiple sites: Secondary | ICD-10-CM | POA: Diagnosis not present

## 2022-06-25 DIAGNOSIS — R5382 Chronic fatigue, unspecified: Secondary | ICD-10-CM | POA: Diagnosis not present

## 2022-11-16 ENCOUNTER — Inpatient Hospital Stay: Payer: Managed Care, Other (non HMO) | Attending: Hematology and Oncology

## 2022-11-16 ENCOUNTER — Other Ambulatory Visit: Payer: Self-pay

## 2022-11-16 DIAGNOSIS — R161 Splenomegaly, not elsewhere classified: Secondary | ICD-10-CM | POA: Diagnosis not present

## 2022-11-16 DIAGNOSIS — D472 Monoclonal gammopathy: Secondary | ICD-10-CM | POA: Diagnosis not present

## 2022-11-16 DIAGNOSIS — D61818 Other pancytopenia: Secondary | ICD-10-CM | POA: Insufficient documentation

## 2022-11-16 LAB — CBC WITH DIFFERENTIAL (CANCER CENTER ONLY)
Abs Immature Granulocytes: 0.02 10*3/uL (ref 0.00–0.07)
Basophils Absolute: 0 10*3/uL (ref 0.0–0.1)
Basophils Relative: 0 %
Eosinophils Absolute: 0 10*3/uL (ref 0.0–0.5)
Eosinophils Relative: 0 %
HCT: 41.2 % (ref 36.0–46.0)
Hemoglobin: 15.1 g/dL — ABNORMAL HIGH (ref 12.0–15.0)
Immature Granulocytes: 0 %
Lymphocytes Relative: 33 %
Lymphs Abs: 2.3 10*3/uL (ref 0.7–4.0)
MCH: 32.5 pg (ref 26.0–34.0)
MCHC: 36.7 g/dL — ABNORMAL HIGH (ref 30.0–36.0)
MCV: 88.8 fL (ref 80.0–100.0)
Monocytes Absolute: 0.5 10*3/uL (ref 0.1–1.0)
Monocytes Relative: 7 %
Neutro Abs: 4 10*3/uL (ref 1.7–7.7)
Neutrophils Relative %: 60 %
Platelet Count: 145 10*3/uL — ABNORMAL LOW (ref 150–400)
RBC: 4.64 MIL/uL (ref 3.87–5.11)
RDW: 12.3 % (ref 11.5–15.5)
WBC Count: 6.9 10*3/uL (ref 4.0–10.5)
nRBC: 0 % (ref 0.0–0.2)

## 2022-11-16 LAB — CMP (CANCER CENTER ONLY)
ALT: 31 U/L (ref 0–44)
AST: 25 U/L (ref 15–41)
Albumin: 4.2 g/dL (ref 3.5–5.0)
Alkaline Phosphatase: 81 U/L (ref 38–126)
Anion gap: 9 (ref 5–15)
BUN: 14 mg/dL (ref 6–20)
CO2: 23 mmol/L (ref 22–32)
Calcium: 9.7 mg/dL (ref 8.9–10.3)
Chloride: 106 mmol/L (ref 98–111)
Creatinine: 0.79 mg/dL (ref 0.44–1.00)
GFR, Estimated: >60 mL/min (ref >60)
Glucose, Bld: 88 mg/dL (ref 70–99)
Potassium: 3.9 mmol/L (ref 3.5–5.1)
Sodium: 138 mmol/L (ref 135–145)
Total Bilirubin: 1 mg/dL (ref 0.3–1.2)
Total Protein: 7.3 g/dL (ref 6.5–8.1)

## 2022-11-19 LAB — KAPPA/LAMBDA LIGHT CHAINS
Kappa free light chain: 23.9 mg/L — ABNORMAL HIGH (ref 3.3–19.4)
Kappa, lambda light chain ratio: 0.05 — ABNORMAL LOW (ref 0.26–1.65)
Lambda free light chains: 456.8 mg/L — ABNORMAL HIGH (ref 5.7–26.3)

## 2022-11-21 LAB — MULTIPLE MYELOMA PANEL, SERUM
Albumin SerPl Elph-Mcnc: 4 g/dL (ref 2.9–4.4)
Albumin/Glob SerPl: 1.2 (ref 0.7–1.7)
Alpha 1: 0.2 g/dL (ref 0.0–0.4)
Alpha2 Glob SerPl Elph-Mcnc: 0.4 g/dL (ref 0.4–1.0)
B-Globulin SerPl Elph-Mcnc: 1.5 g/dL — ABNORMAL HIGH (ref 0.7–1.3)
Gamma Glob SerPl Elph-Mcnc: 1.2 g/dL (ref 0.4–1.8)
Globulin, Total: 3.4 g/dL (ref 2.2–3.9)
IgA: 753 mg/dL — ABNORMAL HIGH (ref 87–352)
IgG (Immunoglobin G), Serum: 1327 mg/dL (ref 586–1602)
IgM (Immunoglobulin M), Srm: 142 mg/dL (ref 26–217)
M Protein SerPl Elph-Mcnc: 0.5 g/dL — ABNORMAL HIGH
Total Protein ELP: 7.4 g/dL (ref 6.0–8.5)

## 2022-11-26 ENCOUNTER — Other Ambulatory Visit: Payer: Self-pay

## 2022-11-26 ENCOUNTER — Inpatient Hospital Stay (HOSPITAL_BASED_OUTPATIENT_CLINIC_OR_DEPARTMENT_OTHER): Payer: Managed Care, Other (non HMO) | Admitting: Hematology and Oncology

## 2022-11-26 ENCOUNTER — Encounter: Payer: Self-pay | Admitting: Hematology and Oncology

## 2022-11-26 VITALS — BP 131/65 | HR 86 | Temp 98.6°F | Wt 156.4 lb

## 2022-11-26 DIAGNOSIS — D61818 Other pancytopenia: Secondary | ICD-10-CM

## 2022-11-26 DIAGNOSIS — D472 Monoclonal gammopathy: Secondary | ICD-10-CM

## 2022-11-26 DIAGNOSIS — R161 Splenomegaly, not elsewhere classified: Secondary | ICD-10-CM | POA: Diagnosis not present

## 2022-11-26 NOTE — Progress Notes (Signed)
[facility] OFFICE PROGRESS NOTE  Patient Care Team: Westley Chandler, MD as PCP - General (Family Medicine) Donnetta Hail, MD as Consulting Physician (Rheumatology) Artis Delay, MD as Consulting Physician (Hematology and Oncology)  ASSESSMENT & PLAN:  MGUS (monoclonal gammopathy of unknown significance) So far, she has no signs of clinical progression I briefly spent some time educating the patient natural history of MGUS. I plan to see her back in 12 months for repeat history and blood work.  Splenomegaly This could be secondary to rheumatoid arthritis or liver disease.   Pancytopenia, acquired [facility]) She has mild intermittent pancytopenia This could be related to her autoimmune condition and splenomegaly Observe only She is not symptomatic  Orders Placed This Encounter  Procedures   CBC with Differential (Cancer Center Only)    Standing Status:   Future    Standing Expiration Date:   11/26/2023   Comprehensive metabolic panel    Standing Status:   Standing    Number of Occurrences:   33    Standing Expiration Date:   11/26/2023    All questions were answered. The patient knows to call the clinic with any problems, questions or concerns. The total time spent in the appointment was 20 minutes encounter with patients including review of chart and various tests results, discussions about plan of care and coordination of care plan   Artis Delay, MD 11/26/2022 8:26 AM  INTERVAL HISTORY: Please see below for problem oriented charting. she returns for surveillance follow-up for MGUS She is not symptomatic She has chronic joint pain consistent with her arthritis problems but nothing new Denies recent infection Spent majority of our visit reviewing test results  REVIEW OF SYSTEMS:   Constitutional: Denies fevers, chills or abnormal weight loss Eyes: Denies blurriness of vision Ears, nose, mouth, throat, and face: Denies mucositis or sore throat Respiratory:  Denies cough, dyspnea or wheezes Cardiovascular: Denies palpitation, chest discomfort or lower extremity swelling Gastrointestinal:  Denies nausea, heartburn or change in bowel habits Skin: Denies abnormal skin rashes Lymphatics: Denies new lymphadenopathy or easy bruising Neurological:Denies numbness, tingling or new weaknesses Behavioral/Psych: Mood is stable, no new changes  All other systems were reviewed with the patient and are negative.  I have reviewed the past medical history, past surgical history, social history and family history with the patient and they are unchanged from previous note.  ALLERGIES:  is allergic to celebrex [celecoxib] and elemental sulfur.  MEDICATIONS:  Current Outpatient Medications  Medication Sig Dispense Refill   CALCIUM CARBONATE-VITAMIN D PO Take 1 tablet by mouth daily.     predniSONE (DELTASONE) 5 MG tablet Take 5 mg by mouth daily as needed (flare up). Reported on 09/12/2015  0   XELJANZ XR 11 MG TB24 Take 11 mg by mouth daily.     No current facility-administered medications for this visit.    SUMMARY OF ONCOLOGIC HISTORY:  [name] is here because of abnormal blood work. She has rheumatoid arthritis and is undergoing chronic treatment. She was treated with methotrexate in the past, over 15 years ago. She has been on intermittent doses of prednisone and prior treatment with Enbrel. Most recently, she was started on Orencia. Her rheumatologist order SPEP which came back abnormal and hence she is referred here. The patient was under the impression she is being referred here because of abnormalities in the liver. On further review, she was noted to have abnormal liver function tests. She had imaging study done in her  abdomen many years ago which show evidence of splenomegaly. She is not aware of prior diagnosis of hepatitis. On further review of her blood work, she is also noted to have thrombocytopenia.The patient denies any recent signs or  symptoms of bleeding such as spontaneous epistaxis, hematuria or hematochezia. She has never received transfusions before.  She denies history of abnormal bone pain or bone fracture. Patient denies history of recurrent infection or atypical infections such as shingles of meningitis. Denies chills, night sweats, anorexia or abnormal weight loss. She had history of seizure disorder but has not taken any medications recently. 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, on observation   PHYSICAL EXAMINATION: ECOG PERFORMANCE STATUS: 0 - Asymptomatic  Vitals:   11/26/22 0809  BP: 131/65  Pulse: 86  Temp: 98.6 F (37 C)  SpO2: 99%   Filed Weights   11/26/22 0809  Weight: 156 lb 6.4 oz (70.9 kg)    GENERAL:alert, no distress and comfortable NEURO: alert & oriented x 3 with fluent speech, no focal motor/sensory deficits  LABORATORY DATA:  I have reviewed the data as listed    Component Value Date/Time   NA 138 11/16/2022 0827   NA 137 06/01/2020 1531   NA 143 08/18/2015 1156   K 3.9 11/16/2022 0827   K 3.5 08/18/2015 1156   CL 106 11/16/2022 0827   CO2 23 11/16/2022 0827   CO2 26 08/18/2015 1156   GLUCOSE 88 11/16/2022 0827   GLUCOSE 107 08/18/2015 1156   BUN 14 11/16/2022 0827   BUN 13 06/01/2020 1531   BUN 12.4 08/18/2015 1156   CREATININE 0.79 11/16/2022 0827   CREATININE 0.9 08/18/2015 1156   CALCIUM 9.7 11/16/2022 0827   CALCIUM 9.1 08/18/2015 1156   PROT 7.3 11/16/2022 0827   PROT 8.2 06/01/2020 1531   PROT 7.7 08/18/2015 1156   ALBUMIN 4.2 11/16/2022 0827   ALBUMIN 5.1 (H) 06/01/2020 1531   ALBUMIN 4.0 08/18/2015 1156   AST 25 11/16/2022 0827   AST 66 (H) 08/18/2015 1156   ALT 31 11/16/2022 0827   ALT 117 (H) 08/18/2015 1156   ALKPHOS 81 11/16/2022 0827   ALKPHOS 70 08/18/2015 1156   BILITOT 1.0 11/16/2022 0827   BILITOT 1.00 08/18/2015 1156   GFRNONAA >60 11/16/2022 0827   GFRAA 91 06/01/2020 1531     No results found for: "SPEP", "UPEP"  Lab Results  Component Value Date   WBC 6.9 11/16/2022   NEUTROABS 4.0 11/16/2022   HGB 15.1 (H) 11/16/2022   HCT 41.2 11/16/2022   MCV 88.8 11/16/2022   PLT 145 (L) 11/16/2022      Chemistry      Component Value Date/Time   NA 138 11/16/2022 0827   NA 137 06/01/2020 1531   NA 143 08/18/2015 1156   K 3.9 11/16/2022 0827   K 3.5 08/18/2015 1156   CL 106 11/16/2022 0827   CO2 23 11/16/2022 0827   CO2 26 08/18/2015 1156   BUN 14 11/16/2022 0827   BUN 13 06/01/2020 1531   BUN 12.4 08/18/2015 1156   CREATININE 0.79 11/16/2022 0827   CREATININE 0.9 08/18/2015 1156      Component Value Date/Time   CALCIUM 9.7 11/16/2022 0827   CALCIUM 9.1 08/18/2015 1156   ALKPHOS 81 11/16/2022 0827   ALKPHOS 70 08/18/2015 1156   AST 25 11/16/2022 0827   AST 66 (H) 08/18/2015 1156   ALT 31 11/16/2022 0827  ALT 117 (H) 08/18/2015 1156   BILITOT 1.0 11/16/2022 0827   BILITOT 1.00 08/18/2015 1156

## 2022-11-26 NOTE — Assessment & Plan Note (Signed)
So far, she has no signs of clinical progression I briefly spent some time educating the patient natural history of MGUS. I plan to see her back in 12 months for repeat history and blood work.

## 2022-11-26 NOTE — Assessment & Plan Note (Signed)
She has mild intermittent pancytopenia This could be related to her autoimmune condition and splenomegaly Observe only She is not symptomatic

## 2022-11-26 NOTE — Assessment & Plan Note (Signed)
This could be secondary to rheumatoid arthritis or liver disease.

## 2022-11-27 ENCOUNTER — Telehealth: Payer: Self-pay | Admitting: Hematology and Oncology

## 2022-11-27 NOTE — Telephone Encounter (Signed)
 Left patient a vm regarding upcoming appointment

## 2022-12-21 NOTE — Progress Notes (Signed)
Late cancel Brandi Singh, MD  [facility] Medicine Teaching Service

## 2022-12-24 ENCOUNTER — Encounter (INDEPENDENT_AMBULATORY_CARE_PROVIDER_SITE_OTHER): Payer: Medicare Other | Admitting: Family Medicine

## 2022-12-24 DIAGNOSIS — Z1231 Encounter for screening mammogram for malignant neoplasm of breast: Secondary | ICD-10-CM

## 2022-12-24 DIAGNOSIS — Z1239 Encounter for other screening for malignant neoplasm of breast: Secondary | ICD-10-CM

## 2022-12-24 DIAGNOSIS — Z1212 Encounter for screening for malignant neoplasm of rectum: Secondary | ICD-10-CM

## 2022-12-24 DIAGNOSIS — Z0000 Encounter for general adult medical examination without abnormal findings: Secondary | ICD-10-CM

## 2022-12-24 DIAGNOSIS — R7989 Other specified abnormal findings of blood chemistry: Secondary | ICD-10-CM | POA: Diagnosis not present

## 2022-12-24 DIAGNOSIS — Z79899 Other long term (current) drug therapy: Secondary | ICD-10-CM | POA: Diagnosis not present

## 2022-12-24 DIAGNOSIS — Z6826 Body mass index (BMI) 26.0-26.9, adult: Secondary | ICD-10-CM | POA: Diagnosis not present

## 2022-12-24 DIAGNOSIS — M0589 Other rheumatoid arthritis with rheumatoid factor of multiple sites: Secondary | ICD-10-CM | POA: Diagnosis not present

## 2022-12-24 DIAGNOSIS — Z111 Encounter for screening for respiratory tuberculosis: Secondary | ICD-10-CM | POA: Diagnosis not present

## 2022-12-24 DIAGNOSIS — R5382 Chronic fatigue, unspecified: Secondary | ICD-10-CM | POA: Diagnosis not present

## 2022-12-24 DIAGNOSIS — Z1211 Encounter for screening for malignant neoplasm of colon: Secondary | ICD-10-CM

## 2022-12-24 DIAGNOSIS — E663 Overweight: Secondary | ICD-10-CM | POA: Diagnosis not present

## 2022-12-24 DIAGNOSIS — Z13220 Encounter for screening for lipoid disorders: Secondary | ICD-10-CM | POA: Diagnosis not present

## 2022-12-24 DIAGNOSIS — D472 Monoclonal gammopathy: Secondary | ICD-10-CM | POA: Diagnosis not present

## 2022-12-24 DIAGNOSIS — E782 Mixed hyperlipidemia: Secondary | ICD-10-CM | POA: Diagnosis not present

## 2023-01-03 NOTE — Progress Notes (Addendum)
SUBJECTIVE:   CHIEF COMPLAINT: follow up HPI:   [name] is a 57 y.o.  with history notable for MGUS, seizure disorder, RA presenting for follow up.   The patient reports overall she is doing great.  She has no new medical history.  She does have a pretty strong family history of type 2 diabetes.  In terms of her rheumatoid arthritis she is well-controlled on Xeljanz.  She follows with NP Azucena Fallen at [facility].  She has a history of monoclonal gammopathy of undetermined significance.  She follows regularly with oncology.  She reports no seizures in the last year  At her oncology visit she was noted to have polycythemia.  As below she is a non-smoker.  No excessive smoke exposure no symptoms of respiratory disease.  She does snore regularly at night and her husband think she needs a CPAP.  He wears 1 and she does have some sleepiness.  She is a non-smoker she does not drink excess alcohol. 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 PERTINENT  PMH / PSH/Family/Social History :   Splenomegaly on prior imaging and evaluation, negative hepatitis C no alcohol use, suspect due to rheumatoid arthritis  OBJECTIVE:   BP 128/70   Pulse 73   Ht 5\' 4"  (1.626 m)   Wt 153 lb (69.4 kg)   SpO2 98%   BMI 26.26 kg/m   Today's weight:  Last Weight  Most recent update: 01/04/2023  9:03 AM    Weight  69.4 kg (153 lb)            Review of prior weights: [employer]   01/04/23 0903  Weight: 153 lb (69.4 kg)   HEENT: EOMI. Sclera without injection or icterus. MMM. External auditory canal examined and WNL. TM normal appearance, no erythema or bulging.  Right EAC does have yellowish appearing cerumen which I tried to remove gently but did not successfully do so.  She will try Debrox as below. Neck: Supple.  Cardiac: Regular rate and rhythm.  Normal S1/S2. No murmurs, rubs, or gallops appreciated. Lungs: Clear bilaterally to ascultation.  Abdomen: Normoactive bowel sounds. No tenderness to deep or light palpation. No rebound or guarding.  Spleen tip is just barely palpable today. Neuro: Speaking in full sentences Ext: No edema  Psych: Pleasant and appropriate    ASSESSMENT/PLAN:   Vitamin B12 deficiency Repeat today.  Seizure disorder [facility]) Discussed going to see neurology.  She is currently on no medications.  She declined at this time.  We discussed not driving if she has had a seizure.  She has not had a seizure in over a year she reports.  TMJ arthritis Ibuprofen as needed.   Right-sided cerumen impaction, recommended Debrox if not improved at follow-up consider ENT referral.  Polycythemia Discussed likely related to suspected of OS   No tobacco or vape use Unlikely Polycythemia vera given stability and relatively low value  STOP BANG fo 3  Sleep study ordered    Annual Exam/HCM Discussed colonoscopy, Pap smear mammogram.  She is agreeable to a mammogram today.  She will follow-up for Pap smear and discussion of her liver disease in further detail.  Will obtain lipids and A1c today. At follow-up we will discuss going to gastroenterology for her elevated fibrosis score and splenomegaly.    Terisa Starr, MD  Family Medicine Teaching Service  [facility]

## 2023-01-04 ENCOUNTER — Ambulatory Visit (INDEPENDENT_AMBULATORY_CARE_PROVIDER_SITE_OTHER): Payer: Managed Care, Other (non HMO) | Admitting: Family Medicine

## 2023-01-04 ENCOUNTER — Encounter: Payer: Self-pay | Admitting: Family Medicine

## 2023-01-04 VITALS — BP 128/70 | HR 73 | Ht 64.0 in | Wt 153.0 lb

## 2023-01-04 DIAGNOSIS — Z0000 Encounter for general adult medical examination without abnormal findings: Secondary | ICD-10-CM

## 2023-01-04 DIAGNOSIS — Z1231 Encounter for screening mammogram for malignant neoplasm of breast: Secondary | ICD-10-CM

## 2023-01-04 DIAGNOSIS — E538 Deficiency of other specified B group vitamins: Secondary | ICD-10-CM

## 2023-01-04 DIAGNOSIS — G40909 Epilepsy, unspecified, not intractable, without status epilepticus: Secondary | ICD-10-CM

## 2023-01-04 DIAGNOSIS — M26641 Arthritis of right temporomandibular joint: Secondary | ICD-10-CM

## 2023-01-04 DIAGNOSIS — D751 Secondary polycythemia: Secondary | ICD-10-CM

## 2023-01-04 DIAGNOSIS — E663 Overweight: Secondary | ICD-10-CM

## 2023-01-04 DIAGNOSIS — Z1211 Encounter for screening for malignant neoplasm of colon: Secondary | ICD-10-CM | POA: Diagnosis not present

## 2023-01-04 MED ORDER — FLUTICASONE PROPIONATE 50 MCG/ACT NA SUSP: 2.0000 | Freq: Every day | NASAL | 6 refills | Status: DC

## 2023-01-04 NOTE — Patient Instructions (Signed)
It was wonderful to see you today.  Please bring ALL of your medications with you to every visit.   Today we talked about:  --[place] work with your health  -Snoring This causes low oxygen at night sometimes. This could cause your increased blood count.  We will get a sleep study to check this  I have referred you for a sleep study to further evaluate your concern. If you do not received a phone call about this appointment within 3-4 weeks, please call our office back at [phone]. Clemencia Course coordinates our referrals and can assist you in this.    I recommend you undergo a mammogram.   You can call to schedule an appointment by calling [phone].   Directions [address] [place], [place] [address]   Debrox--this is over the counter Use nightly for 1 week to loosen up that wax in the right ear  You can use Ibuprofen intermittently for TMJ--let me know if it gets worse  For your congestion--Try flonase twice a day--one spray in each nostril Use as needed when your congestion is bad  Please return for your pap smear  Please let me know if you have questions. I will send you a letter or call you with results.    Please follow up in 12 months   Thank you for choosing [facility].   Please call [phone] with any questions about today's appointment.  Please be sure to schedule follow up at the front  desk before you leave today.   Terisa Starr, MD  Family Medicine

## 2023-01-04 NOTE — Assessment & Plan Note (Signed)
Ibuprofen as needed.

## 2023-01-04 NOTE — Assessment & Plan Note (Signed)
Repeat today

## 2023-01-04 NOTE — Assessment & Plan Note (Signed)
Discussed going to see neurology.  She is currently on no medications.  She declined at this time.  We discussed not driving if she has had a seizure.  She has not had a seizure in over a year she reports.

## 2023-01-04 NOTE — Addendum Note (Signed)
Addended by: Manson Passey, Jazzy Parmer on: 01/04/2023 12:22 PM   Modules accepted: Orders

## 2023-01-05 LAB — LIPID PANEL
Chol/HDL Ratio: 4.1 ratio (ref 0.0–4.4)
Cholesterol, Total: 192 mg/dL (ref 100–199)
HDL: 47 mg/dL (ref >39)
LDL Chol Calc (NIH): 123 mg/dL — ABNORMAL HIGH (ref 0–99)
Triglycerides: 120 mg/dL (ref 0–149)
VLDL Cholesterol Cal: 22 mg/dL (ref 5–40)

## 2023-01-05 LAB — HEMOGLOBIN A1C
Est. average glucose Bld gHb Est-mCnc: 100 mg/dL
Hgb A1c MFr Bld: 5.1 % (ref 4.8–5.6)

## 2023-01-05 LAB — VITAMIN B12: Vitamin B-12: 361 pg/mL (ref 232–1245)

## 2023-02-06 ENCOUNTER — Ambulatory Visit (HOSPITAL_BASED_OUTPATIENT_CLINIC_OR_DEPARTMENT_OTHER): Payer: Managed Care, Other (non HMO) | Attending: Family Medicine | Admitting: Internal Medicine

## 2023-02-06 VITALS — Ht 64.5 in | Wt 155.0 lb

## 2023-02-06 DIAGNOSIS — D751 Secondary polycythemia: Secondary | ICD-10-CM | POA: Insufficient documentation

## 2023-02-06 DIAGNOSIS — I493 Ventricular premature depolarization: Secondary | ICD-10-CM | POA: Diagnosis not present

## 2023-02-06 DIAGNOSIS — R0683 Snoring: Secondary | ICD-10-CM | POA: Diagnosis not present

## 2023-02-09 DIAGNOSIS — D751 Secondary polycythemia: Secondary | ICD-10-CM

## 2023-02-09 NOTE — Procedures (Signed)
    Patient Name: [name], [name] Date: 02/06/2023 Gender: Female D.O.B: 1966/01/04 Age (years): 67 Referring Provider: Westley Chandler Height (inches): 65 Interpreting Physician: Jetty Duhamel MD, ABSM Weight (lbs): 155 RPSGT: Ulyess Mort BMI: 26 MRN: [id number] Neck Size: 13.50  CLINICAL INFORMATION Sleep Study Type: NPSG Indication for sleep study: Snoring Epworth Sleepiness Score: 3  SLEEP STUDY TECHNIQUE As per the AASM Manual for the Scoring of Sleep and Associated Events v2.3 (April 2016) with a hypopnea requiring 4% desaturations.  The channels recorded and monitored were frontal, central and occipital EEG, electrooculogram (EOG), submentalis EMG (chin), nasal and oral airflow, thoracic and abdominal wall motion, anterior tibialis EMG, snore microphone, electrocardiogram, and pulse oximetry.  MEDICATIONS Medications self-administered by patient taken the night of the study : none reported  SLEEP ARCHITECTURE The study was initiated at 10:11:17 PM and ended at 4:31:05 AM.  Sleep onset time was 23.5 minutes and the sleep efficiency was 76.9%. The total sleep time was 292 minutes.  Stage REM latency was 178.5 minutes.  The patient spent 11.0% of the night in stage N1 sleep, 76.5% in stage N2 sleep, 0.2% in stage N3 and 12.3% in REM.  Alpha intrusion was absent.  Supine sleep was 8.56%.  RESPIRATORY PARAMETERS The overall apnea/hypopnea index (AHI) was 4.3 per hour. There were 5 total apneas, including 1 obstructive, 3 central and 1 mixed apneas. There were 16 hypopneas and 162 RERAs.  The AHI during Stage REM sleep was 1.7 per hour.  AHI while supine was 12.0 per hour.  The mean oxygen saturation was 95.4%. The minimum SpO2 during sleep was 90.0%.  soft snoring was noted during this study.  CARDIAC DATA The 2 lead EKG demonstrated sinus rhythm. The mean heart rate was 62.9 beats per minute. Other EKG findings include: PVCs.  LEG MOVEMENT DATA The  total PLMS were 0 with a resulting PLMS index of 0.0. Associated arousal with leg movement index was 0.2 .  IMPRESSIONS - No significant obstructive sleep apnea occurred during this study (AHI = 4.3/h). - No significant central sleep apnea occurred during this study (CAI = 0.6/h). - The patient had minimal or no oxygen desaturation during the study (Min O2 = 90.0%) - The patient snored with soft snoring volume. - EKG findings include PVCs. - Clinically significant periodic limb movements did not occur during sleep. No significant associated arousals.  DIAGNOSIS - Primary Snoring  RECOMMENDATIONS - Manage for snoring and symptoms based on clinical judgment. - Sleep hygiene should be reviewed to assess factors that may improve sleep quality. - Weight management and regular exercise should be initiated or continued if appropriate.  [Electronically signed] 02/09/2023 04:28 PM  Jetty Duhamel MD, ABSM Diplomate, American Board of Sleep Medicine NPI: [id number]                         Jetty Duhamel Diplomate, American Board of Sleep Medicine  ELECTRONICALLY SIGNED ON:  02/09/2023, 4:27 PM [facility] PH: [phone]   FX: [phone] ACCREDITED BY THE AMERICAN ACADEMY OF SLEEP MEDICINE

## 2023-02-10 ENCOUNTER — Encounter: Payer: Self-pay | Admitting: Family Medicine

## 2023-02-28 ENCOUNTER — Ambulatory Visit
Admission: RE | Admit: 2023-02-28 | Discharge: 2023-02-28 | Disposition: A | Discharge status: Home / Self Care | Payer: Managed Care, Other (non HMO) | Source: Ambulatory Visit | Attending: Family Medicine | Admitting: Family Medicine

## 2023-02-28 DIAGNOSIS — Z1231 Encounter for screening mammogram for malignant neoplasm of breast: Secondary | ICD-10-CM

## 2023-03-04 ENCOUNTER — Encounter: Payer: Self-pay | Admitting: Family Medicine

## 2023-07-08 DIAGNOSIS — M0589 Other rheumatoid arthritis with rheumatoid factor of multiple sites: Secondary | ICD-10-CM | POA: Diagnosis not present

## 2023-07-08 DIAGNOSIS — E782 Mixed hyperlipidemia: Secondary | ICD-10-CM | POA: Diagnosis not present

## 2023-07-08 DIAGNOSIS — R5382 Chronic fatigue, unspecified: Secondary | ICD-10-CM | POA: Diagnosis not present

## 2023-07-08 DIAGNOSIS — R7989 Other specified abnormal findings of blood chemistry: Secondary | ICD-10-CM | POA: Diagnosis not present

## 2023-07-08 DIAGNOSIS — E663 Overweight: Secondary | ICD-10-CM | POA: Diagnosis not present

## 2023-07-08 DIAGNOSIS — Z6826 Body mass index (BMI) 26.0-26.9, adult: Secondary | ICD-10-CM | POA: Diagnosis not present

## 2023-10-04 ENCOUNTER — Ambulatory Visit: Admitting: Family Medicine

## 2023-10-04 NOTE — Progress Notes (Deleted)
    SUBJECTIVE:   CHIEF COMPLAINT / HPI:   Spot on foot H/o RA.  PERTINENT  PMH / PSH: ***  OBJECTIVE:   There were no vitals taken for this visit. ***  General: NAD, pleasant, able to participate in exam Cardiac: RRR, no murmurs. Respiratory: CTAB, normal effort, No wheezes, rales or rhonchi Abdomen: Bowel sounds present, nontender, nondistended Extremities: no edema or cyanosis. Skin: warm and dry, no rashes noted Neuro: alert, no obvious focal deficits Psych: Normal affect and mood  ASSESSMENT/PLAN:   No problem-specific Assessment & Plan notes found for this encounter.     Dr. Jonne Netters, DO [facility]    {    This will disappear when note is signed, click to select method of visit    :1}

## 2023-11-19 ENCOUNTER — Inpatient Hospital Stay: Payer: Medicare Other | Attending: Hematology and Oncology

## 2023-11-19 DIAGNOSIS — D472 Monoclonal gammopathy: Secondary | ICD-10-CM | POA: Insufficient documentation

## 2023-11-29 ENCOUNTER — Encounter: Payer: Self-pay | Admitting: Hematology and Oncology

## 2023-11-29 ENCOUNTER — Inpatient Hospital Stay (HOSPITAL_BASED_OUTPATIENT_CLINIC_OR_DEPARTMENT_OTHER): Payer: Medicare Other | Admitting: Hematology and Oncology

## 2023-11-29 ENCOUNTER — Inpatient Hospital Stay

## 2023-11-29 VITALS — BP 156/76 | HR 62 | Temp 97.7°F | Resp 18 | Ht 64.5 in | Wt 154.0 lb

## 2023-11-29 DIAGNOSIS — D472 Monoclonal gammopathy: Secondary | ICD-10-CM | POA: Diagnosis present

## 2023-11-29 LAB — CBC WITH DIFFERENTIAL (CANCER CENTER ONLY)
Abs Immature Granulocytes: 0.01 K/uL (ref 0.00–0.07)
Basophils Absolute: 0 K/uL (ref 0.0–0.1)
Basophils Relative: 1 %
Eosinophils Absolute: 0.1 K/uL (ref 0.0–0.5)
Eosinophils Relative: 3 %
HCT: 40.4 % (ref 36.0–46.0)
Hemoglobin: 14.9 g/dL (ref 12.0–15.0)
Immature Granulocytes: 0 %
Lymphocytes Relative: 36 %
Lymphs Abs: 1.5 K/uL (ref 0.7–4.0)
MCH: 32.3 pg (ref 26.0–34.0)
MCHC: 36.9 g/dL — ABNORMAL HIGH (ref 30.0–36.0)
MCV: 87.4 fL (ref 80.0–100.0)
Monocytes Absolute: 0.4 K/uL (ref 0.1–1.0)
Monocytes Relative: 9 %
Neutro Abs: 2.1 K/uL (ref 1.7–7.7)
Neutrophils Relative %: 51 %
Platelet Count: 134 K/uL — ABNORMAL LOW (ref 150–400)
RBC: 4.62 MIL/uL (ref 3.87–5.11)
RDW: 12 % (ref 11.5–15.5)
WBC Count: 4 K/uL (ref 4.0–10.5)
nRBC: 0 % (ref 0.0–0.2)

## 2023-11-29 LAB — COMPREHENSIVE METABOLIC PANEL WITH GFR
ALT: 23 U/L (ref 0–44)
AST: 22 U/L (ref 15–41)
Albumin: 4.3 g/dL (ref 3.5–5.0)
Alkaline Phosphatase: 83 U/L (ref 38–126)
Anion gap: 7 (ref 5–15)
BUN: 12 mg/dL (ref 6–20)
CO2: 25 mmol/L (ref 22–32)
Calcium: 9.3 mg/dL (ref 8.9–10.3)
Chloride: 107 mmol/L (ref 98–111)
Creatinine, Ser: 0.74 mg/dL (ref 0.44–1.00)
GFR, Estimated: >60 mL/min (ref >60)
Glucose, Bld: 99 mg/dL (ref 70–99)
Potassium: 3.9 mmol/L (ref 3.5–5.1)
Sodium: 139 mmol/L (ref 135–145)
Total Bilirubin: 1 mg/dL (ref 0.0–1.2)
Total Protein: 7.9 g/dL (ref 6.5–8.1)

## 2023-11-29 NOTE — Assessment & Plan Note (Addendum)
 The patient missed her recent lab appointment She is not symptomatic except for joint pain of which she is receiving treatment through her rheumatologist We will repeat labs today and I will call her with test results I briefly spent some time educating the patient natural history of MGUS. I plan to see her back in 12 months for repeat history and blood work, assuming repeat labs today came back stable

## 2023-11-29 NOTE — Progress Notes (Signed)
 [facility] OFFICE PROGRESS NOTE  Patient Care Team: Delores Suzann HERO, MD as PCP - General (Family Medicine) Mai Lynwood FALCON, MD as Consulting Physician (Rheumatology) Lonn Hicks, MD as Consulting Physician (Hematology and Oncology)  ASSESSMENT & PLAN:  Assessment & Plan MGUS (monoclonal gammopathy of unknown significance) The patient missed her recent lab appointment She is not symptomatic except for joint pain of which she is receiving treatment through her rheumatologist We will repeat labs today and I will call her with test results I briefly spent some time educating the patient natural history of MGUS. I plan to see her back in 12 months for repeat history and blood work, assuming repeat labs today came back stable  Orders Placed This Encounter  Procedures   CBC with Differential/Platelet    Standing Status:   Standing    Number of Occurrences:   22    Expiration Date:   04/21/2025     INTERVAL HISTORY: she returns for surveillance follow-up for diagnosis of MGUS Patient denies recurrent infection She did not show up for her recent labs so no labs are available for review  PHYSICAL EXAMINATION: ECOG PERFORMANCE STATUS: 0 - Asymptomatic  Vitals:   11/29/23 0807  BP: (!) 156/76  Pulse: 62  Resp: 18  Temp: 97.7 F (36.5 C)  SpO2: 98%

## 2023-12-02 LAB — MULTIPLE MYELOMA PANEL, SERUM
Albumin SerPl Elph-Mcnc: 4.1 g/dL (ref 2.9–4.4)
Albumin/Glob SerPl: 1.3 (ref 0.7–1.7)
Alpha 1: 0.1 g/dL (ref 0.0–0.4)
Alpha2 Glob SerPl Elph-Mcnc: 0.5 g/dL (ref 0.4–1.0)
B-Globulin SerPl Elph-Mcnc: 1.6 g/dL — ABNORMAL HIGH (ref 0.7–1.3)
Gamma Glob SerPl Elph-Mcnc: 1.1 g/dL (ref 0.4–1.8)
Globulin, Total: 3.4 g/dL (ref 2.2–3.9)
IgA: 856 mg/dL — ABNORMAL HIGH (ref 87–352)
IgG (Immunoglobin G), Serum: 1216 mg/dL (ref 586–1602)
IgM (Immunoglobulin M), Srm: 131 mg/dL (ref 26–217)
M Protein SerPl Elph-Mcnc: 0.6 g/dL — ABNORMAL HIGH
Total Protein ELP: 7.5 g/dL (ref 6.0–8.5)

## 2023-12-02 LAB — KAPPA/LAMBDA LIGHT CHAINS
Kappa free light chain: 26.4 mg/L — ABNORMAL HIGH (ref 3.3–19.4)
Kappa, lambda light chain ratio: 0.04 — ABNORMAL LOW (ref 0.26–1.65)
Lambda free light chains: 625.4 mg/L — ABNORMAL HIGH (ref 5.7–26.3)

## 2023-12-03 ENCOUNTER — Ambulatory Visit: Payer: Self-pay | Admitting: Hematology and Oncology

## 2023-12-03 NOTE — Telephone Encounter (Signed)
-----   Message from Almarie Bedford sent at 12/03/2023  8:54 AM EDT ----- Pls call her and let her know labs are reviewed overall stable ----- Message ----- From: Interface, Lab In [place] Sent: 11/29/2023   8:43 AM EDT To: Almarie Bedford, MD

## 2023-12-03 NOTE — Telephone Encounter (Signed)
 Called pt per MD. LVM for call back to advise about labs.

## 2023-12-04 ENCOUNTER — Telehealth: Payer: Self-pay | Admitting: Hematology and Oncology

## 2023-12-04 NOTE — Telephone Encounter (Signed)
 Spoke with patient confirming upcoming appointment

## 2024-11-30 ENCOUNTER — Other Ambulatory Visit

## 2024-12-10 ENCOUNTER — Ambulatory Visit: Admitting: Hematology and Oncology
